# Patient Record
Sex: Female | Born: 1990
Health system: Southern US, Community
[De-identification: ages and names within clinical notes are randomized; demographics above are authoritative.]

## PROBLEM LIST (undated history)

## (undated) ENCOUNTER — Inpatient Hospital Stay (HOSPITAL_COMMUNITY): Payer: Self-pay

## (undated) DIAGNOSIS — O4443 Low lying placenta NOS or without hemorrhage, third trimester: Secondary | ICD-10-CM

## (undated) DIAGNOSIS — Z8619 Personal history of other infectious and parasitic diseases: Secondary | ICD-10-CM

## (undated) DIAGNOSIS — Z789 Other specified health status: Secondary | ICD-10-CM

## (undated) DIAGNOSIS — R87629 Unspecified abnormal cytological findings in specimens from vagina: Secondary | ICD-10-CM

## (undated) HISTORY — DX: Unspecified abnormal cytological findings in specimens from vagina: R87.629

## (undated) HISTORY — PX: BUNIONECTOMY: SHX129

## (undated) HISTORY — DX: Low lying placenta nos or without hemorrhage, third trimester: O44.43

## (undated) HISTORY — DX: Personal history of other infectious and parasitic diseases: Z86.19

---

## 2011-11-15 ENCOUNTER — Encounter: Payer: Self-pay | Admitting: *Deleted

## 2011-11-15 ENCOUNTER — Emergency Department (HOSPITAL_COMMUNITY)
Admission: EM | Admit: 2011-11-15 | Discharge: 2011-11-16 | Disposition: A | Discharge status: Home / Self Care | Payer: Commercial Indemnity | Attending: Emergency Medicine | Admitting: Emergency Medicine

## 2011-11-15 DIAGNOSIS — R3915 Urgency of urination: Secondary | ICD-10-CM | POA: Insufficient documentation

## 2011-11-15 DIAGNOSIS — N898 Other specified noninflammatory disorders of vagina: Secondary | ICD-10-CM | POA: Insufficient documentation

## 2011-11-15 DIAGNOSIS — N390 Urinary tract infection, site not specified: Secondary | ICD-10-CM

## 2011-11-15 DIAGNOSIS — N3090 Cystitis, unspecified without hematuria: Secondary | ICD-10-CM | POA: Insufficient documentation

## 2011-11-15 DIAGNOSIS — R300 Dysuria: Secondary | ICD-10-CM | POA: Insufficient documentation

## 2011-11-15 DIAGNOSIS — R319 Hematuria, unspecified: Secondary | ICD-10-CM | POA: Insufficient documentation

## 2011-11-15 NOTE — ED Notes (Signed)
Pt reports UTI symptoms 3 hours ago.  States she has hematuria, frequency, pain with urination, malodorous, cloudy urine.   Denies back/flank pain.

## 2011-11-16 ENCOUNTER — Encounter (HOSPITAL_COMMUNITY): Payer: Self-pay

## 2011-11-16 LAB — URINALYSIS, ROUTINE W REFLEX MICROSCOPIC
Glucose, UA: NEGATIVE mg/dL
Ketones, ur: 15 mg/dL — AB
Nitrite: POSITIVE — AB
Protein, ur: 100 mg/dL — AB
Specific Gravity, Urine: 1.022 (ref 1.005–1.030)
Urobilinogen, UA: 2 mg/dL — ABNORMAL HIGH (ref 0.0–1.0)
pH: 6 (ref 5.0–8.0)

## 2011-11-16 LAB — URINE MICROSCOPIC-ADD ON

## 2011-11-16 LAB — WET PREP, GENITAL
Clue Cells Wet Prep HPF POC: NONE SEEN
Trich, Wet Prep: NONE SEEN
Yeast Wet Prep HPF POC: NONE SEEN

## 2011-11-16 LAB — POCT PREGNANCY, URINE: Preg Test, Ur: NEGATIVE

## 2011-11-16 MED ORDER — CIPROFLOXACIN HCL 500 MG PO TABS
500.0000 mg | ORAL_TABLET | Freq: Once | ORAL | Status: AC
  Administered 2011-11-16: 500 mg via ORAL
  Filled 2011-11-16: qty 1

## 2011-11-16 MED ORDER — CIPROFLOXACIN HCL 500 MG PO TABS: 500.0000 mg | ORAL_TABLET | Freq: Two times a day (BID) | ORAL | Status: AC

## 2011-11-16 NOTE — ED Provider Notes (Signed)
History     CSN: [id number]  Arrival date & time 11/15/11  2147   First MD Initiated Contact with Patient 11/16/11 0125      Chief Complaint  Patient presents with  . Urinary Tract Infection  . Hematuria    (Consider location/radiation/quality/duration/timing/severity/associated sxs/prior treatment) Patient is a 20 y.o. female presenting with urinary tract infection and hematuria. The history is provided by the patient. No language interpreter was used.  Urinary Tract Infection This is a recurrent problem. The current episode started 3 to 5 hours ago. The problem occurs constantly. The problem has not changed since onset.Pertinent negatives include no chest pain, no abdominal pain, no headaches and no shortness of breath. Associated symptoms comments: No flank pain. The symptoms are aggravated by nothing. The symptoms are relieved by nothing. She has tried nothing for the symptoms. The treatment provided no relief.  Hematuria This is a new problem. The current episode started today. The problem is unchanged. She describes the hematuria as gross hematuria. The hematuria occurs throughout her entire urinary stream. She reports no clotting in her urine stream. Her pain is at a severity of 4/10. The pain is moderate. She describes her urine color as tea colored. Irritative symptoms include urgency. Associated symptoms include dysuria. Pertinent negatives include no abdominal pain, facial swelling, flank pain, genital pain, nausea or vomiting. (Vaginal discharge) She is sexually active. There is no history of GU trauma, kidney stones or STDs.    History reviewed. No pertinent past medical history.  History reviewed. No pertinent past surgical history.  History reviewed. No pertinent family history.  History  Substance Use Topics  . Smoking status: Never Smoker   . Smokeless tobacco: Not on file  . Alcohol Use: 0.6 oz/week    1 Cans of beer per week    OB History    Grav Para Term  Preterm Abortions TAB SAB Ect Mult Living                  Review of Systems  Constitutional: Positive for activity change.  HENT: Negative for facial swelling.   Respiratory: Negative for shortness of breath.   Cardiovascular: Negative for chest pain.  Gastrointestinal: Negative for nausea, vomiting and abdominal pain.  Genitourinary: Positive for dysuria, urgency, hematuria and vaginal discharge. Negative for flank pain.  Musculoskeletal: Negative.   Skin: Negative.   Neurological: Negative for headaches.  Hematological: Negative.   Psychiatric/Behavioral: Negative.     Allergies  Other  Home Medications   Current Outpatient Rx  Name Route Sig Dispense Refill  . CIPROFLOXACIN HCL 500 MG PO TABS Oral Take 1 tablet (500 mg total) by mouth 2 (two) times daily. 14 tablet 0    BP 130/87  Pulse 86  Temp(Src) 97.5 F (36.4 C) (Oral)  Resp 18  SpO2 97%  Physical Exam  Constitutional: She is oriented to person, place, and time. She appears well-developed and well-nourished.  HENT:  Head: Normocephalic and atraumatic.  Mouth/Throat: Oropharynx is clear and moist.  Eyes: Conjunctivae are normal. Pupils are equal, round, and reactive to light.  Neck: Normal range of motion. Neck supple.  Cardiovascular: Normal rate and regular rhythm.   Pulmonary/Chest: Effort normal and breath sounds normal. She has no wheezes.  Abdominal: Soft. Bowel sounds are normal. There is no tenderness. There is no rebound and no guarding.  Genitourinary: Cervix exhibits discharge. Cervix exhibits no motion tenderness. Right adnexum displays no tenderness. Left adnexum displays no tenderness.  Ida as chaperone  Musculoskeletal: Normal range of motion.  Neurological: She is alert and oriented to person, place, and time.  Skin: Skin is warm and dry.  Psychiatric: Thought content normal.    ED Course  Procedures (including critical care time)  Labs Reviewed  URINALYSIS, ROUTINE W REFLEX  MICROSCOPIC - Abnormal; Notable for the following:    Color, Urine RED (*) BIOCHEMICALS MAY BE AFFECTED BY COLOR   APPearance TURBID (*)    Hgb urine dipstick LARGE (*)    Bilirubin Urine SMALL (*)    Ketones, ur 15 (*)    Protein, ur 100 (*)    Urobilinogen, UA 2.0 (*)    Nitrite POSITIVE (*)    Leukocytes, UA LARGE (*)    All other components within normal limits  URINE MICROSCOPIC-ADD ON - Abnormal; Notable for the following:    Bacteria, UA MANY (*)    All other components within normal limits  WET PREP, GENITAL - Abnormal; Notable for the following:    WBC, Wet Prep HPF POC FEW (*)    All other components within normal limits  POCT PREGNANCY, URINE  GC/CHLAMYDIA PROBE AMP, GENITAL   No results found.   1. Urinary tract infection   2. Cystitis       MDM  Will be called in 3 days with the results of your cultures        Anzley Dibbern K Florentino Laabs-Rasch, MD 11/16/11 [phone]

## 2011-11-19 LAB — GC/CHLAMYDIA PROBE AMP, GENITAL
Chlamydia, DNA Probe: POSITIVE — AB
GC Probe Amp, Genital: NEGATIVE

## 2011-11-20 NOTE — ED Notes (Signed)
+  Chlamydia Chart sent to EDP office for review.  

## 2013-10-19 ENCOUNTER — Encounter (HOSPITAL_COMMUNITY): Payer: Self-pay | Admitting: Emergency Medicine

## 2013-10-19 DIAGNOSIS — R112 Nausea with vomiting, unspecified: Secondary | ICD-10-CM | POA: Insufficient documentation

## 2013-10-19 DIAGNOSIS — R630 Anorexia: Secondary | ICD-10-CM | POA: Insufficient documentation

## 2013-10-19 DIAGNOSIS — Z3202 Encounter for pregnancy test, result negative: Secondary | ICD-10-CM | POA: Insufficient documentation

## 2013-10-19 DIAGNOSIS — R109 Unspecified abdominal pain: Secondary | ICD-10-CM | POA: Insufficient documentation

## 2013-10-19 LAB — POCT PREGNANCY, URINE: Preg Test, Ur: NEGATIVE

## 2013-10-19 NOTE — ED Notes (Signed)
Pt. reports nausea and vomitting onset this afternoon , denies diarrhea , no fever or chills. 

## 2013-10-20 ENCOUNTER — Emergency Department (HOSPITAL_COMMUNITY)
Admission: EM | Admit: 2013-10-20 | Discharge: 2013-10-20 | Disposition: A | Discharge status: Home / Self Care | Payer: BC Managed Care – PPO | Attending: Emergency Medicine | Admitting: Emergency Medicine

## 2013-10-20 DIAGNOSIS — R109 Unspecified abdominal pain: Secondary | ICD-10-CM

## 2013-10-20 DIAGNOSIS — R1110 Vomiting, unspecified: Secondary | ICD-10-CM

## 2013-10-20 LAB — COMPREHENSIVE METABOLIC PANEL
ALT: 9 U/L (ref 0–35)
AST: 19 U/L (ref 0–37)
Albumin: 4.2 g/dL (ref 3.5–5.2)
Alkaline Phosphatase: 41 U/L (ref 39–117)
BUN: 12 mg/dL (ref 6–23)
CO2: 24 mEq/L (ref 19–32)
Calcium: 8.9 mg/dL (ref 8.4–10.5)
Chloride: 105 mEq/L (ref 96–112)
Creatinine, Ser: 0.71 mg/dL (ref 0.50–1.10)
GFR calc Af Amer: >90 mL/min (ref >90)
GFR calc non Af Amer: >90 mL/min (ref >90)
Glucose, Bld: 88 mg/dL (ref 70–99)
Potassium: 3.9 mEq/L (ref 3.5–5.1)
Sodium: 141 mEq/L (ref 135–145)
Total Bilirubin: 1.3 mg/dL — ABNORMAL HIGH (ref 0.3–1.2)
Total Protein: 7.4 g/dL (ref 6.0–8.3)

## 2013-10-20 LAB — URINALYSIS, ROUTINE W REFLEX MICROSCOPIC
Bilirubin Urine: NEGATIVE
Glucose, UA: NEGATIVE mg/dL
Hgb urine dipstick: NEGATIVE
Ketones, ur: 40 mg/dL — AB
Nitrite: NEGATIVE
Protein, ur: NEGATIVE mg/dL
Specific Gravity, Urine: 1.031 — ABNORMAL HIGH (ref 1.005–1.030)
Urobilinogen, UA: 1 mg/dL (ref 0.0–1.0)
pH: 8 (ref 5.0–8.0)

## 2013-10-20 LAB — CBC WITH DIFFERENTIAL/PLATELET
Basophils Absolute: 0 10*3/uL (ref 0.0–0.1)
Basophils Relative: 0 % (ref 0–1)
Eosinophils Absolute: 0 10*3/uL (ref 0.0–0.7)
Eosinophils Relative: 1 % (ref 0–5)
HCT: 40.1 % (ref 36.0–46.0)
Hemoglobin: 14.1 g/dL (ref 12.0–15.0)
Lymphocytes Relative: 7 % — ABNORMAL LOW (ref 12–46)
Lymphs Abs: 0.5 10*3/uL — ABNORMAL LOW (ref 0.7–4.0)
MCH: 31.9 pg (ref 26.0–34.0)
MCHC: 35.2 g/dL (ref 30.0–36.0)
MCV: 90.7 fL (ref 78.0–100.0)
Monocytes Absolute: 0.4 10*3/uL (ref 0.1–1.0)
Monocytes Relative: 6 % (ref 3–12)
Neutro Abs: 6.3 10*3/uL (ref 1.7–7.7)
Neutrophils Relative %: 87 % — ABNORMAL HIGH (ref 43–77)
Platelets: 292 10*3/uL (ref 150–400)
RBC: 4.42 MIL/uL (ref 3.87–5.11)
RDW: 13.2 % (ref 11.5–15.5)
WBC: 7.2 10*3/uL (ref 4.0–10.5)

## 2013-10-20 LAB — URINE MICROSCOPIC-ADD ON

## 2013-10-20 MED ORDER — ONDANSETRON 4 MG PO TBDP
4.0000 mg | ORAL_TABLET | Freq: Once | ORAL | Status: AC
  Administered 2013-10-20: 4 mg via ORAL
  Filled 2013-10-20: qty 1

## 2013-10-20 MED ORDER — ONDANSETRON 4 MG PO TBDP: ORAL_TABLET | ORAL | Status: DC

## 2013-10-20 NOTE — ED Notes (Signed)
Pt denies CP, SOB, abdominal pain, Diarrhea, urinary problems, vaginal discharge, pain. Only symptom is constant nausea starting at 4pm and vomited x2

## 2013-10-20 NOTE — ED Provider Notes (Signed)
CSN: [id number]     Arrival date & time 10/19/13  2245 History   First MD Initiated Contact with Patient 10/20/13 0101     Chief Complaint  Patient presents with  . Emesis   (Consider location/radiation/quality/duration/timing/severity/associated sxs/prior Treatment) HPI Comments: 22 yo female with no medical hx, mild etoh, non smoker presents with few episodes of vomiting since this afternoon. No sick contacts or travel.  No abd pain, mild cramping.  No urinary or pelvic sxs.  Same sexual partner.  Nothing improves, intermittent.   Patient is a 22 y.o. female presenting with vomiting. The history is provided by the patient.  Emesis Associated symptoms: no abdominal pain, no chills and no headaches     History reviewed. No pertinent past medical history. History reviewed. No pertinent past surgical history. No family history on file. History  Substance Use Topics  . Smoking status: Never Smoker   . Smokeless tobacco: Not on file  . Alcohol Use: 0.6 oz/week    1 Cans of beer per week   OB History   Grav Para Term Preterm Abortions TAB SAB Ect Mult Living                 Review of Systems  Constitutional: Positive for appetite change. Negative for fever and chills.  HENT: Negative for congestion.   Respiratory: Negative for shortness of breath.   Cardiovascular: Negative for chest pain.  Gastrointestinal: Positive for nausea and vomiting. Negative for abdominal pain.  Genitourinary: Negative for dysuria and flank pain.  Musculoskeletal: Negative for back pain, neck pain and neck stiffness.  Skin: Negative for rash.  Neurological: Negative for light-headedness and headaches.    Allergies  Other  Home Medications  No current outpatient prescriptions on file. BP 136/61  Pulse 78  Temp(Src) 98.1 F (36.7 C) (Oral)  Resp 18  Ht 5\' 7"  (1.702 m)  Wt 125 lb 3 oz (56.785 kg)  BMI 19.60 kg/m2  SpO2 99%  LMP 09/18/2013 Physical Exam  Nursing note and vitals  reviewed. Constitutional: She is oriented to person, place, and time. She appears well-developed and well-nourished.  HENT:  Head: Normocephalic and atraumatic.  Mild dry mm  Eyes: Conjunctivae are normal. Right eye exhibits no discharge. Left eye exhibits no discharge.  Neck: Normal range of motion. Neck supple. No tracheal deviation present.  Cardiovascular: Normal rate and regular rhythm.   Pulmonary/Chest: Effort normal and breath sounds normal.  Abdominal: Soft. She exhibits no distension. There is tenderness (mild central/ suprapubic). There is no guarding.  Musculoskeletal: She exhibits no edema.  Neurological: She is alert and oriented to person, place, and time.  Skin: Skin is warm. No rash noted.  Psychiatric: She has a normal mood and affect.    ED Course  Procedures (including critical care time) Labs Review Labs Reviewed  URINALYSIS, ROUTINE W REFLEX MICROSCOPIC - Abnormal; Notable for the following:    APPearance CLOUDY (*)    Specific Gravity, Urine 1.031 (*)    Ketones, ur 40 (*)    Leukocytes, UA SMALL (*)    All other components within normal limits  CBC WITH DIFFERENTIAL - Abnormal; Notable for the following:    Neutrophils Relative % 87 (*)    Lymphocytes Relative 7 (*)    Lymphs Abs 0.5 (*)    All other components within normal limits  COMPREHENSIVE METABOLIC PANEL - Abnormal; Notable for the following:    Total Bilirubin 1.3 (*)    All other components within normal limits  URINE MICROSCOPIC-ADD ON - Abnormal; Notable for the following:    Squamous Epithelial / LPF MANY (*)    Bacteria, UA FEW (*)    All other components within normal limits  URINE CULTURE  POCT PREGNANCY, URINE   Imaging Review No results found.  EKG Interpretation   None       MDM   1. Vomiting   2. Abdominal cramping    Likely gastritis/ viral.  Labs, po fluids, antiemetics. Plan for recheck. Low concern for acute abdo or appy at this time.  Pt on menstrual period.    Pt improved on recheck.  Results and differential diagnosis were discussed with the patient. Close follow up outpatient was discussed, patient comfortable with the plan.     Enid Skeens, MD 10/21/13 [facility]

## 2013-10-21 LAB — URINE CULTURE: Colony Count: >=100000

## 2015-06-07 LAB — OB RESULTS CONSOLE GC/CHLAMYDIA
Chlamydia: NEGATIVE
Gonorrhea: NEGATIVE

## 2015-06-07 LAB — OB RESULTS CONSOLE HIV ANTIBODY (ROUTINE TESTING): HIV: NONREACTIVE

## 2015-06-07 LAB — OB RESULTS CONSOLE ANTIBODY SCREEN: Antibody Screen: NEGATIVE

## 2015-06-07 LAB — OB RESULTS CONSOLE RPR: RPR: NONREACTIVE

## 2015-06-07 LAB — OB RESULTS CONSOLE HEPATITIS B SURFACE ANTIGEN: Hepatitis B Surface Ag: NEGATIVE

## 2015-06-07 LAB — OB RESULTS CONSOLE ABO/RH: RH Type: POSITIVE

## 2015-06-07 LAB — OB RESULTS CONSOLE RUBELLA ANTIBODY, IGM: Rubella: IMMUNE

## 2015-11-19 NOTE — L&D Delivery Note (Signed)
Delivery Note At 5:28 AM a viable and healthy female was delivered via Vaginal, Spontaneous Delivery (Presentation: Right Occiput Anterior).  APGAR: 6, 9; weight  pending.   Placenta status: Intact, Spontaneous  Pathology for maternal temp.  Cord: 3 vessels with the following complications: None.  Cord pH: none  Anesthesia: Epidural  Episiotomy: None Lacerations: 2nd degree;Perineal Suture Repair: 3.0 chromic Est. Blood Loss (mL):  400  Mom to postpartum.  Baby to Couplet care / Skin to Skin.  Morgan Perry A 01/15/2016, 6:32 AM

## 2015-12-06 LAB — OB RESULTS CONSOLE GBS: GBS: NEGATIVE

## 2015-12-08 ENCOUNTER — Other Ambulatory Visit (HOSPITAL_COMMUNITY): Payer: Self-pay | Admitting: Obstetrics and Gynecology

## 2015-12-08 DIAGNOSIS — Z3689 Encounter for other specified antenatal screening: Secondary | ICD-10-CM

## 2015-12-08 DIAGNOSIS — O283 Abnormal ultrasonic finding on antenatal screening of mother: Secondary | ICD-10-CM

## 2015-12-08 DIAGNOSIS — Z3A36 36 weeks gestation of pregnancy: Secondary | ICD-10-CM

## 2015-12-12 ENCOUNTER — Other Ambulatory Visit (HOSPITAL_COMMUNITY): Payer: Self-pay | Admitting: Obstetrics and Gynecology

## 2015-12-12 ENCOUNTER — Encounter (HOSPITAL_COMMUNITY): Payer: Self-pay

## 2015-12-12 ENCOUNTER — Ambulatory Visit (HOSPITAL_COMMUNITY): Admission: RE | Admit: 2015-12-12 | Payer: BLUE CROSS/BLUE SHIELD | Source: Ambulatory Visit

## 2015-12-12 ENCOUNTER — Ambulatory Visit (HOSPITAL_COMMUNITY)
Admission: RE | Admit: 2015-12-12 | Discharge: 2015-12-12 | Disposition: A | Discharge status: Home / Self Care | Payer: BLUE CROSS/BLUE SHIELD | Source: Ambulatory Visit | Attending: Obstetrics and Gynecology | Admitting: Obstetrics and Gynecology

## 2015-12-12 DIAGNOSIS — Z3A36 36 weeks gestation of pregnancy: Secondary | ICD-10-CM

## 2015-12-12 DIAGNOSIS — Z3689 Encounter for other specified antenatal screening: Secondary | ICD-10-CM

## 2015-12-12 DIAGNOSIS — O283 Abnormal ultrasonic finding on antenatal screening of mother: Secondary | ICD-10-CM

## 2015-12-12 DIAGNOSIS — Z36 Encounter for antenatal screening of mother: Secondary | ICD-10-CM | POA: Insufficient documentation

## 2015-12-12 DIAGNOSIS — O329XX0 Maternal care for malpresentation of fetus, unspecified, not applicable or unspecified: Secondary | ICD-10-CM

## 2015-12-13 ENCOUNTER — Encounter (HOSPITAL_COMMUNITY): Payer: Self-pay

## 2016-01-01 ENCOUNTER — Other Ambulatory Visit: Payer: Self-pay | Admitting: Obstetrics and Gynecology

## 2016-01-01 ENCOUNTER — Telehealth (HOSPITAL_COMMUNITY): Payer: Self-pay | Admitting: *Deleted

## 2016-01-01 ENCOUNTER — Encounter (HOSPITAL_COMMUNITY): Payer: Self-pay | Admitting: *Deleted

## 2016-01-01 NOTE — Telephone Encounter (Signed)
Preadmission screen  

## 2016-01-03 ENCOUNTER — Inpatient Hospital Stay (HOSPITAL_COMMUNITY)
Admission: AD | Admit: 2016-01-03 | Discharge: 2016-01-03 | Disposition: A | Discharge status: Home / Self Care | Payer: Managed Care, Other (non HMO) | Source: Ambulatory Visit | Attending: Obstetrics and Gynecology | Admitting: Obstetrics and Gynecology

## 2016-01-03 ENCOUNTER — Encounter (HOSPITAL_COMMUNITY): Payer: Self-pay | Admitting: *Deleted

## 2016-01-03 DIAGNOSIS — Z3A39 39 weeks gestation of pregnancy: Secondary | ICD-10-CM | POA: Diagnosis not present

## 2016-01-03 DIAGNOSIS — O26893 Other specified pregnancy related conditions, third trimester: Secondary | ICD-10-CM | POA: Diagnosis not present

## 2016-01-03 DIAGNOSIS — N898 Other specified noninflammatory disorders of vagina: Secondary | ICD-10-CM | POA: Insufficient documentation

## 2016-01-03 HISTORY — DX: Other specified health status: Z78.9

## 2016-01-03 LAB — URINALYSIS, DIPSTICK ONLY
Bilirubin Urine: NEGATIVE
Glucose, UA: NEGATIVE mg/dL
Ketones, ur: NEGATIVE mg/dL
Nitrite: NEGATIVE
Protein, ur: NEGATIVE mg/dL
Specific Gravity, Urine: 1.01 (ref 1.005–1.030)
pH: 6.5 (ref 5.0–8.0)

## 2016-01-03 LAB — AMNISURE RUPTURE OF MEMBRANE (ROM) NOT AT ARMC: Amnisure ROM: NEGATIVE

## 2016-01-03 NOTE — Discharge Instructions (Signed)
Third Trimester of Pregnancy The third trimester is from week 29 through week 42, months 7 through 9. This trimester is when your unborn baby (fetus) is growing very fast. At the end of the ninth month, the unborn baby is about 20 inches in length. It weighs about 6-10 pounds.  HOME CARE   Avoid all smoking, herbs, and alcohol. Avoid drugs not approved by your doctor.  Do not use any tobacco products, including cigarettes, chewing tobacco, and electronic cigarettes. If you need help quitting, ask your doctor. You may get counseling or other support to help you quit.  Only take medicine as told by your doctor. Some medicines are safe and some are not during pregnancy.  Exercise only as told by your doctor. Stop exercising if you start having cramps.  Eat regular, healthy meals.  Wear a good support bra if your breasts are tender.  Do not use hot tubs, steam rooms, or saunas.  Wear your seat belt when driving.  Avoid raw meat, uncooked cheese, and liter boxes and soil used by cats.  Take your prenatal vitamins.  Take 1500-2000 milligrams of calcium daily starting at the 20th week of pregnancy until you deliver your baby.  Try taking medicine that helps you poop (stool softener) as needed, and if your doctor approves. Eat more fiber by eating fresh fruit, vegetables, and whole grains. Drink enough fluids to keep your pee (urine) clear or pale yellow.  Take warm water baths (sitz baths) to soothe pain or discomfort caused by hemorrhoids. Use hemorrhoid cream if your doctor approves.  If you have puffy, bulging veins (varicose veins), wear support hose. Raise (elevate) your feet for 15 minutes, 3-4 times a day. Limit salt in your diet.  Avoid heavy lifting, wear low heels, and sit up straight.  Rest with your legs raised if you have leg cramps or low back pain.  Visit your dentist if you have not gone during your pregnancy. Use a soft toothbrush to brush your teeth. Be gentle when you  floss.  You can have sex (intercourse) unless your doctor tells you not to.  Do not travel far distances unless you must. Only do so with your doctor's approval.  Take prenatal classes.  Practice driving to the hospital.  Pack your hospital bag.  Prepare the baby's room.  Go to your doctor visits. GET HELP IF:  You are not sure if you are in labor or if your water has broken.  You are dizzy.  You have mild cramps or pressure in your lower belly (abdominal).  You have a nagging pain in your belly area.  You continue to feel sick to your stomach (nauseous), throw up (vomit), or have watery poop (diarrhea).  You have bad smelling fluid coming from your vagina.  You have pain with peeing (urination). GET HELP RIGHT AWAY IF:   You have a fever.  You are leaking fluid from your vagina.  You are spotting or bleeding from your vagina.  You have severe belly cramping or pain.  You lose or gain weight rapidly.  You have trouble catching your breath and have chest pain.  You notice sudden or extreme puffiness (swelling) of your face, hands, ankles, feet, or legs.  You have not felt the baby move in over an hour.  You have severe headaches that do not go away with medicine.  You have vision changes.   This information is not intended to replace advice given to you by your health care  provider. Make sure you discuss any questions you have with your health care provider. °  °Document Released: 01/29/2010 Document Revised: 11/25/2014 Document Reviewed: 01/05/2013 °Elsevier Interactive Patient Education ©2016 [employer] °Braxton Hicks Contractions °Contractions of the uterus can occur throughout pregnancy. Contractions are not always a sign that you are in labor.  °WHAT ARE BRAXTON HICKS CONTRACTIONS?  °Contractions that occur before labor are called Braxton Hicks contractions, or false labor. Toward the end of pregnancy (32-34 weeks), these contractions can develop more often  and may become more forceful. This is not true labor because these contractions do not result in opening (dilatation) and thinning of the cervix. They are sometimes difficult to tell apart from true labor because these contractions can be forceful and people have different pain tolerances. You should not feel embarrassed if you go to the hospital with false labor. Sometimes, the only way to tell if you are in true labor is for your health care provider to look for changes in the cervix. °If there are no prenatal problems or other health problems associated with the pregnancy, it is completely safe to be sent home with false labor and await the onset of true labor. °HOW CAN YOU TELL THE DIFFERENCE BETWEEN TRUE AND FALSE LABOR? °False Labor °· The contractions of false labor are usually shorter and not as hard as those of true labor.   °· The contractions are usually irregular.   °· The contractions are often felt in the front of the lower abdomen and in the groin.   °· The contractions may go away when you walk around or change positions while lying down.   °· The contractions get weaker and are shorter lasting as time goes on.   °· The contractions do not usually become progressively stronger, regular, and closer together as with true labor.   °True Labor °· Contractions in true labor last 30-70 seconds, become very regular, usually become more intense, and increase in frequency.   °· The contractions do not go away with walking.   °· The discomfort is usually felt in the top of the uterus and spreads to the lower abdomen and low back.   °· True labor can be determined by your health care provider with an exam. This will show that the cervix is dilating and getting thinner.   °WHAT TO REMEMBER °· Keep up with your usual exercises and follow other instructions given by your health care provider.   °· Take medicines as directed by your health care provider.   °· Keep your regular prenatal appointments.   °· Eat and  drink lightly if you think you are going into labor.   °· If Braxton Hicks contractions are making you uncomfortable:   °· Change your position from lying down or resting to walking, or from walking to resting.   °· Sit and rest in a tub of warm water.   °· Drink 2-3 glasses of water. Dehydration may cause these contractions.   °· Do slow and deep breathing several times an hour.   °WHEN SHOULD I SEEK IMMEDIATE MEDICAL CARE? °Seek immediate medical care if: °· Your contractions become stronger, more regular, and closer together.   °· You have fluid leaking or gushing from your vagina.   °· You have a fever.   °· You pass blood-tinged mucus.   °· You have vaginal bleeding.   °· You have continuous abdominal pain.   °· You have low back pain that you never had before.   °· You feel your baby's head pushing down and causing pelvic pressure.   °· Your baby is not moving as much as it   used to.    This information is not intended to replace advice given to you by your health care provider. Make sure you discuss any questions you have with your health care provider.   Document Released: 11/04/2005 Document Revised: 11/09/2013 Document Reviewed: 08/16/2013 Elsevier Interactive Patient Education 2016 [place]. Fetal Movement Counts Patient Name: __________________________________________________ Patient Due Date: ____________________ Performing a fetal movement count is highly recommended in high-risk pregnancies, but it is good for every pregnant woman to do. Your health care provider may ask you to start counting fetal movements at 28 weeks of the pregnancy. Fetal movements often increase:  After eating a full meal.  After physical activity.  After eating or drinking something sweet or cold.  At rest. Pay attention to when you feel the baby is most active. This will help you notice a pattern of your baby's sleep and wake cycles and what factors contribute to an increase in fetal movement. It is  important to perform a fetal movement count at the same time each day when your baby is normally most active.  HOW TO COUNT FETAL MOVEMENTS  Find a quiet and comfortable area to sit or lie down on your left side. Lying on your left side provides the best blood and oxygen circulation to your baby.  Write down the day and time on a sheet of paper or in a journal.  Start counting kicks, flutters, swishes, rolls, or jabs in a 2-hour period. You should feel at least 10 movements within 2 hours.  If you do not feel 10 movements in 2 hours, wait 2-3 hours and count again. Look for a change in the pattern or not enough counts in 2 hours. SEEK MEDICAL CARE IF:  You feel less than 10 counts in 2 hours, tried twice.  There is no movement in over an hour.  The pattern is changing or taking longer each day to reach 10 counts in 2 hours.  You feel the baby is not moving as he or she usually does. Date: ____________ Movements: ____________ Start time: ____________ Morgan Perry time: ____________  Date: ____________ Movements: ____________ Start time: ____________ Morgan Perry time: ____________ Date: ____________ Movements: ____________ Start time: ____________ Morgan Perry time: ____________ Date: ____________ Movements: ____________ Start time: ____________ Morgan Perry time: ____________ Date: ____________ Movements: ____________ Start time: ____________ Morgan Perry time: ____________ Date: ____________ Movements: ____________ Start time: ____________ Morgan Perry time: ____________ Date: ____________ Movements: ____________ Start time: ____________ Morgan Perry time: ____________ Date: ____________ Movements: ____________ Start time: ____________ Morgan Perry time: ____________  Date: ____________ Movements: ____________ Start time: ____________ Morgan Perry time: ____________ Date: ____________ Movements: ____________ Start time: ____________ Morgan Perry time: ____________ Date: ____________ Movements: ____________ Start time: ____________ Morgan Perry time:  ____________ Date: ____________ Movements: ____________ Start time: ____________ Morgan Perry time: ____________ Date: ____________ Movements: ____________ Start time: ____________ Morgan Perry time: ____________ Date: ____________ Movements: ____________ Start time: ____________ Morgan Perry time: ____________ Date: ____________ Movements: ____________ Start time: ____________ Morgan Perry time: ____________  Date: ____________ Movements: ____________ Start time: ____________ Morgan Perry time: ____________ Date: ____________ Movements: ____________ Start time: ____________ Morgan Perry time: ____________ Date: ____________ Movements: ____________ Start time: ____________ Morgan Perry time: ____________ Date: ____________ Movements: ____________ Start time: ____________ Morgan Perry time: ____________ Date: ____________ Movements: ____________ Start time: ____________ Morgan Perry time: ____________ Date: ____________ Movements: ____________ Start time: ____________ Morgan Perry time: ____________ Date: ____________ Movements: ____________ Start time: ____________ Morgan Perry time: ____________  Date: ____________ Movements: ____________ Start time: ____________ Morgan Perry time: ____________ Date: ____________ Movements: ____________ Start time: ____________ Morgan Perry time: ____________ Date: ____________ Movements: ____________ Start time: ____________  Finish time: ____________ Date: ____________ Movements: ____________ Start time: ____________ Morgan Perry time: ____________ Date: ____________ Movements: ____________ Start time: ____________ Morgan Perry time: ____________ Date: ____________ Movements: ____________ Start time: ____________ Morgan Perry time: ____________ Date: ____________ Movements: ____________ Start time: ____________ Morgan Perry time: ____________  Date: ____________ Movements: ____________ Start time: ____________ Morgan Perry time: ____________ Date: ____________ Movements: ____________ Start time: ____________ Morgan Perry time: ____________ Date: ____________ Movements:  ____________ Start time: ____________ Morgan Perry time: ____________ Date: ____________ Movements: ____________ Start time: ____________ Morgan Perry time: ____________ Date: ____________ Movements: ____________ Start time: ____________ Morgan Perry time: ____________ Date: ____________ Movements: ____________ Start time: ____________ Morgan Perry time: ____________ Date: ____________ Movements: ____________ Start time: ____________ Morgan Perry time: ____________  Date: ____________ Movements: ____________ Start time: ____________ Morgan Perry time: ____________ Date: ____________ Movements: ____________ Start time: ____________ Morgan Perry time: ____________ Date: ____________ Movements: ____________ Start time: ____________ Morgan Perry time: ____________ Date: ____________ Movements: ____________ Start time: ____________ Morgan Perry time: ____________ Date: ____________ Movements: ____________ Start time: ____________ Morgan Perry time: ____________ Date: ____________ Movements: ____________ Start time: ____________ Morgan Perry time: ____________ Date: ____________ Movements: ____________ Start time: ____________ Morgan Perry time: ____________  Date: ____________ Movements: ____________ Start time: ____________ Morgan Perry time: ____________ Date: ____________ Movements: ____________ Start time: ____________ Morgan Perry time: ____________ Date: ____________ Movements: ____________ Start time: ____________ Morgan Perry time: ____________ Date: ____________ Movements: ____________ Start time: ____________ Morgan Perry time: ____________ Date: ____________ Movements: ____________ Start time: ____________ Morgan Perry time: ____________ Date: ____________ Movements: ____________ Start time: ____________ Morgan Perry time: ____________ Date: ____________ Movements: ____________ Start time: ____________ Morgan Perry time: ____________  Date: ____________ Movements: ____________ Start time: ____________ Morgan Perry time: ____________ Date: ____________ Movements: ____________ Start time: ____________ Morgan Perry  time: ____________ Date: ____________ Movements: ____________ Start time: ____________ Morgan Perry time: ____________ Date: ____________ Movements: ____________ Start time: ____________ Morgan Perry time: ____________ Date: ____________ Movements: ____________ Start time: ____________ Morgan Perry time: ____________ Date: ____________ Movements: ____________ Start time: ____________ Morgan Perry time: ____________   This information is not intended to replace advice given to you by your health care provider. Make sure you discuss any questions you have with your health care provider.   Document Released: 12/04/2006 Document Revised: 11/25/2014 Document Reviewed: 08/31/2012 Elsevier Interactive Patient Education [employer].

## 2016-01-03 NOTE — MAU Note (Signed)
Patient states she noticed more fluid yesterday on pad, but not much during the evening yesterday.  No discharge noted on pantyliner this am, but increased fluid noted today with a small gush.  Discharge has been yellow in color.

## 2016-01-14 ENCOUNTER — Encounter (HOSPITAL_COMMUNITY): Payer: Self-pay

## 2016-01-14 ENCOUNTER — Inpatient Hospital Stay (HOSPITAL_COMMUNITY): Payer: Managed Care, Other (non HMO) | Admitting: Anesthesiology

## 2016-01-14 ENCOUNTER — Inpatient Hospital Stay (HOSPITAL_COMMUNITY)
Admission: RE | Admit: 2016-01-14 | Discharge: 2016-01-17 | DRG: 775 | Disposition: A | Discharge status: Home / Self Care | Payer: Managed Care, Other (non HMO) | Source: Ambulatory Visit | Attending: Obstetrics and Gynecology | Admitting: Obstetrics and Gynecology

## 2016-01-14 DIAGNOSIS — O411290 Chorioamnionitis, unspecified trimester, not applicable or unspecified: Secondary | ICD-10-CM | POA: Diagnosis present

## 2016-01-14 DIAGNOSIS — O9081 Anemia of the puerperium: Secondary | ICD-10-CM | POA: Diagnosis not present

## 2016-01-14 DIAGNOSIS — Z3A40 40 weeks gestation of pregnancy: Secondary | ICD-10-CM

## 2016-01-14 DIAGNOSIS — Z8279 Family history of other congenital malformations, deformations and chromosomal abnormalities: Secondary | ICD-10-CM

## 2016-01-14 DIAGNOSIS — D62 Acute posthemorrhagic anemia: Secondary | ICD-10-CM | POA: Diagnosis not present

## 2016-01-14 DIAGNOSIS — O480 Post-term pregnancy: Secondary | ICD-10-CM | POA: Diagnosis present

## 2016-01-14 DIAGNOSIS — O411230 Chorioamnionitis, third trimester, not applicable or unspecified: Secondary | ICD-10-CM | POA: Diagnosis present

## 2016-01-14 LAB — CBC
HCT: 36.3 % (ref 36.0–46.0)
HCT: 36.8 % (ref 36.0–46.0)
Hemoglobin: 12.3 g/dL (ref 12.0–15.0)
Hemoglobin: 12.5 g/dL (ref 12.0–15.0)
MCH: 28.7 pg (ref 26.0–34.0)
MCH: 28.9 pg (ref 26.0–34.0)
MCHC: 33.9 g/dL (ref 30.0–36.0)
MCHC: 34 g/dL (ref 30.0–36.0)
MCV: 84.6 fL (ref 78.0–100.0)
MCV: 85.2 fL (ref 78.0–100.0)
Platelets: 275 10*3/uL (ref 150–400)
Platelets: 280 10*3/uL (ref 150–400)
RBC: 4.26 MIL/uL (ref 3.87–5.11)
RBC: 4.35 MIL/uL (ref 3.87–5.11)
RDW: 14.1 % (ref 11.5–15.5)
RDW: 14.2 % (ref 11.5–15.5)
WBC: 11.2 10*3/uL — ABNORMAL HIGH (ref 4.0–10.5)
WBC: 7.3 10*3/uL (ref 4.0–10.5)

## 2016-01-14 LAB — RPR: RPR Ser Ql: NONREACTIVE

## 2016-01-14 MED ORDER — EPHEDRINE 5 MG/ML INJ: 10.0000 mg | INTRAVENOUS | Status: DC | PRN

## 2016-01-14 MED ORDER — LACTATED RINGERS IV SOLN
INTRAVENOUS | Status: DC
  Administered 2016-01-14 (×4): via INTRAVENOUS

## 2016-01-14 MED ORDER — OXYTOCIN 10 UNIT/ML IJ SOLN: 10.0000 [IU] | Freq: Once | INTRAMUSCULAR | Status: DC

## 2016-01-14 MED ORDER — LACTATED RINGERS IV SOLN
INTRAVENOUS | Status: DC
Start: 2016-01-14 — End: 2016-01-15

## 2016-01-14 MED ORDER — OXYTOCIN BOLUS FROM INFUSION
500.0000 mL | INTRAVENOUS | Status: DC
  Administered 2016-01-15: 500 mL via INTRAVENOUS

## 2016-01-14 MED ORDER — TERBUTALINE SULFATE 1 MG/ML IJ SOLN
INTRAMUSCULAR | Status: AC
  Filled 2016-01-14: qty 1

## 2016-01-14 MED ORDER — OXYTOCIN 10 UNIT/ML IJ SOLN: 2.5000 [IU]/h | INTRAVENOUS | Status: DC

## 2016-01-14 MED ORDER — OXYCODONE-ACETAMINOPHEN 5-325 MG PO TABS: 2.0000 | ORAL_TABLET | ORAL | Status: DC | PRN

## 2016-01-14 MED ORDER — LACTATED RINGERS IV SOLN
500.0000 mL | INTRAVENOUS | Status: DC | PRN
  Administered 2016-01-14 – 2016-01-15 (×2): 500 mL via INTRAVENOUS

## 2016-01-14 MED ORDER — LIDOCAINE HCL (PF) 1 % IJ SOLN
INTRAMUSCULAR | Status: DC | PRN
  Administered 2016-01-14 (×2): 4 mL via EPIDURAL

## 2016-01-14 MED ORDER — PHENYLEPHRINE 40 MCG/ML (10ML) SYRINGE FOR IV PUSH (FOR BLOOD PRESSURE SUPPORT)
80.0000 ug | PREFILLED_SYRINGE | INTRAVENOUS | Status: DC | PRN
  Filled 2016-01-14: qty 20

## 2016-01-14 MED ORDER — LACTATED RINGERS IV SOLN
500.0000 mL | Freq: Once | INTRAVENOUS | Status: AC
  Administered 2016-01-14: 500 mL via INTRAVENOUS

## 2016-01-14 MED ORDER — CITRIC ACID-SODIUM CITRATE 334-500 MG/5ML PO SOLN
30.0000 mL | ORAL | Status: DC | PRN
  Filled 2016-01-14: qty 15

## 2016-01-14 MED ORDER — PHENYLEPHRINE 40 MCG/ML (10ML) SYRINGE FOR IV PUSH (FOR BLOOD PRESSURE SUPPORT): 80.0000 ug | PREFILLED_SYRINGE | INTRAVENOUS | Status: DC | PRN

## 2016-01-14 MED ORDER — DIPHENHYDRAMINE HCL 50 MG/ML IJ SOLN: 12.5000 mg | INTRAMUSCULAR | Status: DC | PRN

## 2016-01-14 MED ORDER — OXYCODONE-ACETAMINOPHEN 5-325 MG PO TABS: 1.0000 | ORAL_TABLET | ORAL | Status: DC | PRN

## 2016-01-14 MED ORDER — OXYTOCIN 10 UNIT/ML IJ SOLN
1.0000 m[IU]/min | INTRAVENOUS | Status: DC
  Administered 2016-01-14: 2 m[IU]/min via INTRAVENOUS
  Administered 2016-01-14: 9 m[IU]/min via INTRAVENOUS
  Filled 2016-01-14: qty 4

## 2016-01-14 MED ORDER — ONDANSETRON HCL 4 MG/2ML IJ SOLN
4.0000 mg | Freq: Four times a day (QID) | INTRAMUSCULAR | Status: DC | PRN
  Filled 2016-01-14 (×2): qty 2

## 2016-01-14 MED ORDER — TERBUTALINE SULFATE 1 MG/ML IJ SOLN: 0.2500 mg | Freq: Once | INTRAMUSCULAR | Status: DC | PRN

## 2016-01-14 MED ORDER — FENTANYL 2.5 MCG/ML BUPIVACAINE 1/10 % EPIDURAL INFUSION (WH - ANES)
14.0000 mL/h | INTRAMUSCULAR | Status: DC | PRN
  Administered 2016-01-14 (×2): 14 mL/h via EPIDURAL
  Filled 2016-01-14 (×2): qty 125

## 2016-01-14 MED ORDER — BUTORPHANOL TARTRATE 1 MG/ML IJ SOLN
2.0000 mg | INTRAMUSCULAR | Status: DC | PRN
  Administered 2016-01-14 (×2): 2 mg via INTRAVENOUS
  Filled 2016-01-14 (×2): qty 2

## 2016-01-14 MED ORDER — ACETAMINOPHEN 325 MG PO TABS
650.0000 mg | ORAL_TABLET | ORAL | Status: DC | PRN
  Administered 2016-01-15: 650 mg via ORAL
  Filled 2016-01-14 (×2): qty 2

## 2016-01-14 MED ORDER — LIDOCAINE HCL (PF) 1 % IJ SOLN
30.0000 mL | INTRAMUSCULAR | Status: DC | PRN
  Administered 2016-01-15: 30 mL via SUBCUTANEOUS
  Filled 2016-01-14: qty 30

## 2016-01-14 MED ORDER — LACTATED RINGERS IV SOLN
INTRAVENOUS | Status: DC
  Administered 2016-01-14: 17:00:00 via INTRAUTERINE

## 2016-01-14 MED ORDER — MISOPROSTOL 25 MCG QUARTER TABLET
25.0000 ug | ORAL_TABLET | ORAL | Status: DC | PRN
  Administered 2016-01-14: 25 ug via VAGINAL
  Filled 2016-01-14: qty 0.25

## 2016-01-14 MED ORDER — ZOLPIDEM TARTRATE 5 MG PO TABS
5.0000 mg | ORAL_TABLET | Freq: Every evening | ORAL | Status: DC | PRN
Start: 2016-01-14 — End: 2016-01-15

## 2016-01-14 NOTE — Progress Notes (Signed)
S; comfortable  O:  Pitocin 19 miu VE 5/100/-1 per RN Amnioinfusion  Tracing: baseline 145 (+) accels some early decels Ctx q  1 1/2-  IMP: postdates Prolonged active phase P) reexam 2hr after last exam. Cont pitocin

## 2016-01-14 NOTE — Progress Notes (Signed)
S; comfortable  O: Pitocin Epidural  VE loose 4 cm/90/-3/-2 AROM mod mec ISE/IUPC placed  Tracing: baseline 135 (+) variability (+) accels Ctx q 2 mins  FHR deceleration down to 70's x 1 1/2 min with return to baseline noted thereafter with good variability Not responsive to positional( maternal) change ( left/right) Pitocin discontinued( 18 miu) Fluid bolus  IMP: protracted latent/active phase Post dates P)  Pitocin remain off. Close fetal monitoring. Resume pitocin if not satisfactory ctx strength and tracing ok

## 2016-01-14 NOTE — Anesthesia Procedure Notes (Signed)
Epidural Patient location during procedure: OB Start time: 01/14/2016 3:39 PM  Staffing Anesthesiologist: Mal Amabile Performed by: anesthesiologist   Preanesthetic Checklist Completed: patient identified, site marked, surgical consent, pre-op evaluation, timeout performed, IV checked, risks and benefits discussed and monitors and equipment checked  Epidural Patient position: sitting Prep: site prepped and draped and DuraPrep Patient monitoring: continuous pulse ox and blood pressure Approach: midline Location: L4-L5 Injection technique: LOR air  Needle:  Needle type: Tuohy  Needle gauge: 17 G Needle length: 9 cm and 9 Needle insertion depth: 4 cm Catheter type: closed end flexible Catheter size: 19 Gauge Catheter at skin depth: 9 cm Test dose: negative and Other  Assessment Events: blood not aspirated, injection not painful, no injection resistance, negative IV test and no paresthesia  Additional Notes Patient identified. Risks and benefits discussed including failed block, incomplete  Pain control, post dural puncture headache, nerve damage, paralysis, blood pressure Changes, nausea, vomiting, reactions to medications-both toxic and allergic and post Partum back pain. All questions were answered. Patient expressed understanding and wished to proceed. Sterile technique was used throughout procedure. Epidural site was Dressed with sterile barrier dressing. No paresthesias, signs of intravascular injection Or signs of intrathecal spread were encountered.  Patient was more comfortable after the epidural was dosed. Please see RN's note for documentation of vital signs and FHR which are stable.

## 2016-01-14 NOTE — Progress Notes (Signed)
S; notes some painful ctx S/p cytotec x 1  O: pitocin 4 miu VE: 1/50/-3 vtx Intact Foley balloon placed  Tracing: baseline 130 (+) accel Couplets, triplets  IMP: Postdates P) cont pitocin. Left exaggerated sims position

## 2016-01-14 NOTE — Consults (Signed)
  Anesthesia Pain Consult Note  Patient: Morgan Perry, 25 y.o., female  Consult Requested by: Maxie Better, MD  Reason for Consult: CRNA Pain Management rounds Level of Consciousness: alert  Pain: 7 /10 Pain goal 7  Last Vitals:  Filed Vitals:   01/14/16 0832 01/14/16 0905  BP: 124/46 133/76  Pulse: 69 72  Temp:    Resp: 18 18    Plan: Epidural infusion for pain control.  Deuntae Kocsis 01/14/2016

## 2016-01-14 NOTE — H&P (Signed)
Morgan Perry is a 25 y.o. female presenting for IOL @ 40 4/[redacted] weeks gestation due to postdates. Intact membrane. GBS cx neg  Maternal Medical History:  Fetal activity: Perceived fetal activity is normal.    Prenatal complications: no prenatal complications   OB History    Gravida Para Term Preterm AB TAB SAB Ectopic Multiple Living       Past Medical History  Diagnosis Date  . Low lying placenta nos or without hemorrhage, third trimester   . Hx of chlamydia infection   . Medical history non-contributory    Past Surgical History  Procedure Laterality Date  . Bunionectomy      R foot   Family History: family history includes Cancer in her paternal uncle; Down syndrome in her cousin; Hypertension in her maternal grandmother. Social History:  reports that she has never smoked. She does not have any smokeless tobacco history on file. She reports that she drinks about 0.6 oz of alcohol per week. She reports that she does not use illicit drugs.   Prenatal Transfer Tool  Maternal Diabetes: No Genetic Screening: Normal Maternal Ultrasounds/Referrals: Normal Fetal Ultrasounds or other Referrals:  None Maternal Substance Abuse:  No Significant Maternal Medications:  None Significant Maternal Lab Results:  Lab values include: Group B Strep negative Other Comments:  None  Review of Systems  All other systems reviewed and are negative.     Last menstrual period 04/04/2015. Exam Physical Exam  Constitutional: She is oriented to person, place, and time. She appears well-developed and well-nourished.  HENT:  Head: Atraumatic.  Eyes: EOM are normal.  Neck: Neck supple.  Cardiovascular: Regular rhythm.   Respiratory: Effort normal.  GI: Soft.  Musculoskeletal: Normal range of motion.  Neurological: She is alert and oriented to person, place, and time.  Skin: Skin is warm and dry.  Psychiatric: She has a normal mood and affect.    Prenatal labs: ABO, Rh:  O/Positive/-- (07/20 0000) Antibody: Negative (07/20 0000) Rubella: Immune (07/20 0000) RPR: Nonreactive (07/20 0000)  HBsAg: Negative (07/20 0000)  HIV: Non-reactive (07/20 0000)  GBS: Negative (01/18 0000)   Assessment/Plan: Postdates P) admit routine labs. Cytotec. Analgesic/epidural prn. Amniotomy prn. IV pitocin prn   Jeovanny Cuadros A 01/14/2016, 12:34 AM

## 2016-01-14 NOTE — Anesthesia Preprocedure Evaluation (Signed)
Anesthesia Evaluation  Patient identified by MRN, date of birth, ID band Patient awake    Reviewed: Allergy & Precautions, H&P , Patient's Chart, lab work & pertinent test results  Airway Mallampati: II  TM Distance: >3 FB Neck ROM: full    Dental no notable dental hx. (+) Teeth Intact   Pulmonary neg pulmonary ROS,    Pulmonary exam normal breath sounds clear to auscultation       Cardiovascular negative cardio ROS Normal cardiovascular exam Rhythm:regular Rate:Normal     Neuro/Psych negative neurological ROS  negative psych ROS   GI/Hepatic negative GI ROS, Neg liver ROS,   Endo/Other  negative endocrine ROS  Renal/GU negative Renal ROS  negative genitourinary   Musculoskeletal   Abdominal   Peds  Hematology negative hematology ROS (+)   Anesthesia Other Findings   Reproductive/Obstetrics (+) Pregnancy                             Anesthesia Physical Anesthesia Plan  ASA: II  Anesthesia Plan: Epidural   Post-op Pain Management:    Induction:   Airway Management Planned:   Additional Equipment:   Intra-op Plan:   Post-operative Plan:   Informed Consent: I have reviewed the patients History and Physical, chart, labs and discussed the procedure including the risks, benefits and alternatives for the proposed anesthesia with the patient or authorized representative who has indicated his/her understanding and acceptance.     Plan Discussed with: Anesthesiologist  Anesthesia Plan Comments:         Anesthesia Quick Evaluation  

## 2016-01-15 ENCOUNTER — Encounter (HOSPITAL_COMMUNITY): Payer: Self-pay

## 2016-01-15 DIAGNOSIS — O411290 Chorioamnionitis, unspecified trimester, not applicable or unspecified: Secondary | ICD-10-CM | POA: Diagnosis present

## 2016-01-15 LAB — CBC
HCT: 27.9 % — ABNORMAL LOW (ref 36.0–46.0)
Hemoglobin: 9.4 g/dL — ABNORMAL LOW (ref 12.0–15.0)
MCH: 28.7 pg (ref 26.0–34.0)
MCHC: 33.7 g/dL (ref 30.0–36.0)
MCV: 85.3 fL (ref 78.0–100.0)
Platelets: 254 10*3/uL (ref 150–400)
RBC: 3.27 MIL/uL — ABNORMAL LOW (ref 3.87–5.11)
RDW: 14.3 % (ref 11.5–15.5)
WBC: 23.8 10*3/uL — ABNORMAL HIGH (ref 4.0–10.5)

## 2016-01-15 MED ORDER — ONDANSETRON HCL 4 MG PO TABS: 4.0000 mg | ORAL_TABLET | ORAL | Status: DC | PRN

## 2016-01-15 MED ORDER — ZOLPIDEM TARTRATE 5 MG PO TABS: 5.0000 mg | ORAL_TABLET | Freq: Every evening | ORAL | Status: DC | PRN

## 2016-01-15 MED ORDER — METHYLERGONOVINE MALEATE 0.2 MG/ML IJ SOLN: 0.2000 mg | INTRAMUSCULAR | Status: DC | PRN

## 2016-01-15 MED ORDER — ACETAMINOPHEN 325 MG PO TABS
325.0000 mg | ORAL_TABLET | Freq: Once | ORAL | Status: AC
  Administered 2016-01-15: 325 mg via ORAL

## 2016-01-15 MED ORDER — SODIUM CHLORIDE 0.9% FLUSH
3.0000 mL | INTRAVENOUS | Status: DC | PRN
  Administered 2016-01-15: 3 mL via INTRAVENOUS

## 2016-01-15 MED ORDER — FERROUS SULFATE 325 (65 FE) MG PO TABS
325.0000 mg | ORAL_TABLET | Freq: Two times a day (BID) | ORAL | Status: DC
  Administered 2016-01-15 – 2016-01-17 (×3): 325 mg via ORAL
  Filled 2016-01-15 (×4): qty 1

## 2016-01-15 MED ORDER — BENZOCAINE-MENTHOL 20-0.5 % EX AERO
1.0000 "application " | INHALATION_SPRAY | CUTANEOUS | Status: DC | PRN
  Administered 2016-01-15: 1 via TOPICAL
  Filled 2016-01-15 (×2): qty 56

## 2016-01-15 MED ORDER — VANCOMYCIN HCL IN DEXTROSE 1-5 GM/200ML-% IV SOLN
1000.0000 mg | Freq: Two times a day (BID) | INTRAVENOUS | Status: DC
  Filled 2016-01-15: qty 200

## 2016-01-15 MED ORDER — METHYLERGONOVINE MALEATE 0.2 MG PO TABS: 0.2000 mg | ORAL_TABLET | ORAL | Status: DC | PRN

## 2016-01-15 MED ORDER — WITCH HAZEL-GLYCERIN EX PADS
1.0000 "application " | MEDICATED_PAD | CUTANEOUS | Status: DC | PRN
  Administered 2016-01-15: 1 via TOPICAL

## 2016-01-15 MED ORDER — GENTAMICIN SULFATE 40 MG/ML IJ SOLN
170.0000 mg | Freq: Three times a day (TID) | INTRAVENOUS | Status: DC
  Administered 2016-01-15 – 2016-01-16 (×3): 170 mg via INTRAVENOUS
  Filled 2016-01-15 (×4): qty 4.25

## 2016-01-15 MED ORDER — LANOLIN HYDROUS EX OINT: TOPICAL_OINTMENT | CUTANEOUS | Status: DC | PRN

## 2016-01-15 MED ORDER — GENTAMICIN SULFATE 40 MG/ML IJ SOLN
180.0000 mg | Freq: Once | INTRAVENOUS | Status: AC
  Administered 2016-01-15: 180 mg via INTRAVENOUS
  Filled 2016-01-15: qty 4.5

## 2016-01-15 MED ORDER — DIBUCAINE 1 % RE OINT
1.0000 "application " | TOPICAL_OINTMENT | RECTAL | Status: DC | PRN
  Administered 2016-01-15: 1 via RECTAL
  Filled 2016-01-15 (×2): qty 28

## 2016-01-15 MED ORDER — OXYCODONE-ACETAMINOPHEN 5-325 MG PO TABS: 2.0000 | ORAL_TABLET | ORAL | Status: DC | PRN

## 2016-01-15 MED ORDER — VANCOMYCIN HCL IN DEXTROSE 1-5 GM/200ML-% IV SOLN
1000.0000 mg | Freq: Two times a day (BID) | INTRAVENOUS | Status: DC
  Administered 2016-01-15 – 2016-01-16 (×3): 1000 mg via INTRAVENOUS
  Filled 2016-01-15 (×4): qty 200

## 2016-01-15 MED ORDER — MISOPROSTOL 200 MCG PO TABS
ORAL_TABLET | ORAL | Status: AC
  Administered 2016-01-15: 800 ug
  Filled 2016-01-15: qty 4

## 2016-01-15 MED ORDER — PRENATAL MULTIVITAMIN CH
1.0000 | ORAL_TABLET | Freq: Every day | ORAL | Status: DC
  Administered 2016-01-15 – 2016-01-17 (×3): 1 via ORAL
  Filled 2016-01-15 (×3): qty 1

## 2016-01-15 MED ORDER — OXYCODONE-ACETAMINOPHEN 5-325 MG PO TABS: 1.0000 | ORAL_TABLET | ORAL | Status: DC | PRN

## 2016-01-15 MED ORDER — DIPHENHYDRAMINE HCL 25 MG PO CAPS: 25.0000 mg | ORAL_CAPSULE | Freq: Four times a day (QID) | ORAL | Status: DC | PRN

## 2016-01-15 MED ORDER — ACETAMINOPHEN 500 MG PO TABS
1000.0000 mg | ORAL_TABLET | Freq: Once | ORAL | Status: AC
Start: 2016-01-15 — End: 2016-01-15
  Administered 2016-01-15: 1000 mg via ORAL
  Filled 2016-01-15: qty 2

## 2016-01-15 MED ORDER — ONDANSETRON HCL 4 MG/2ML IJ SOLN: 4.0000 mg | INTRAMUSCULAR | Status: DC | PRN

## 2016-01-15 MED ORDER — SENNOSIDES-DOCUSATE SODIUM 8.6-50 MG PO TABS
2.0000 | ORAL_TABLET | ORAL | Status: DC
  Administered 2016-01-15 – 2016-01-17 (×2): 2 via ORAL
  Filled 2016-01-15 (×2): qty 2

## 2016-01-15 MED ORDER — SIMETHICONE 80 MG PO CHEW
80.0000 mg | CHEWABLE_TABLET | ORAL | Status: DC | PRN
  Administered 2016-01-16: 80 mg via ORAL
  Filled 2016-01-15: qty 1

## 2016-01-15 MED ORDER — IBUPROFEN 600 MG PO TABS
600.0000 mg | ORAL_TABLET | Freq: Four times a day (QID) | ORAL | Status: DC
  Administered 2016-01-15 – 2016-01-17 (×9): 600 mg via ORAL
  Filled 2016-01-15 (×9): qty 1

## 2016-01-15 NOTE — Progress Notes (Signed)
S: occ rectal pressure  O: temp 100.7( max) down to 100.2 Pitocin Epidural On Vanc/Gent VE fully +1 per RN Tracing: baseline 140 (+) accel  Small variables Ctx q 2-32mins  IMP: postdates Presumed chorioamnionitis on Vanc/gent P) laboring vtx down. Cont with antibiotics

## 2016-01-15 NOTE — Progress Notes (Signed)
Patient ID: Morgan Perry, female   DOB: 05/20/1991, 25 y.o.   MRN: [id number] INTERVAL NOTE:  S:   Laying in bed trying to get some rest, min cramping, (+) voids, small bleed, denies HA/NV/dizziness  O:    Today's Vitals   01/15/16 0700 01/15/16 0721 01/15/16 0841 01/15/16 0929  BP: 147/75  130/68 131/78  Pulse: 129  115 97  Temp: 103.5 F (39.7 C) 102.8 F (39.3 C) 101.1 F (38.4 C) 99.4 F (37.4 C)  TempSrc: Axillary Axillary Oral   Resp: Height:      Weight:      SpO2:    100%  PainSc: 0-No pain       AAO x 3, NAD  U-even  Scant lochia  A / P:   PPD #0  Stable post partum  Presumed Chorioamnionitis - temps trending down, stable  Continue Vancomycin/Gentamycin therapy per pharmacy protocol  Routine PP orders  Kenard Gower, MSN, CNM 01/15/2016, 9:41 AM

## 2016-01-15 NOTE — Progress Notes (Signed)
Patient assisted with hand expression and set up with double electric breast pump. Encouraged to pump every 3 hours for 15 minutes. Colostrum easily expressed.No colostrum obtained with Breast pump.

## 2016-01-15 NOTE — Progress Notes (Addendum)
ANTIBIOTIC CONSULT NOTE - INITIAL  Pharmacy Consult for Gentamicin and Vancomycin Indication: Maternal temperature  Allergies  Allergen Reactions  . Amoxicillin Other (See Comments)    Childhood reaction    Patient Measurements: Height:  (170.2 cm) Weight: 161 lb (73.029 kg) IBW/kg (Calculated) : 61.6 Adjusted Body Weight: 65  Vital Signs: Temp: 100.7 F (38.2 C) (02/27 0030) Temp Source: Axillary (02/27 0030) BP: 147/67 mmHg (02/27 0030) Pulse Rate: 112 (02/27 0030)  Labs:  Recent Labs  01/14/16 0033 01/14/16 1850  WBC 7.3 11.2*  HGB 12.5 12.3  PLT 280 275   No results for input(s): GENTTROUGH, GENTPEAK, GENTRANDOM in the last 72 hours.   Microbiology: No results found for this or any previous visit (from the past 720 hour(s)).  Medications:   Assessment: 25 y.o. female G1P0000 at [redacted]w[redacted]d with increased temperature in active labor. Estimated Gentamicin Ke = 0.412, Vd = 26 Liters Estimated Vancomycin Ke= 0.7,  Vd =  50 Liters  Goal of Therapy:  Gentamicin peak 6-8 mg/L and Trough < 1 mg/L Vancomycin troughs 15-20 mg/L  Plan:  Vancomycin 1 Gm IV every 12 hours Gentamicin 180 mg IV x 1  Gentamicin 170 mg IV every 8 hrs Check Scr with next labs if gentamicin continued. Will check gentamicin and/or vancomycin levels if continued > 72hr or clinically indicated.  Arelia Sneddon 01/15/2016,1:04 AM

## 2016-01-15 NOTE — Lactation Note (Signed)
This note was copied from a baby's chart. Lactation Consultation Note Initial visit at 12 hours of age.  Mom was previously visiting with baby in NICU.  Mom has DEBP at bedside and reports a little pain with pumping.  Mom reports hand expressing a few drops and already brought to NICU.  Reviewed instructions on DEBP set up cleaning and frequency.  #24 flange appears to be a good fit and discussed using #27 if she continues to have pain with pumping.  # dots given to mom to put on EBM as she collects.  Reviewed NICU booklet for feeding baby.  Mom denies further concerns at this time.   Patient Name: Morgan Perry Today's Date: 01/15/2016 Reason for consult: Initial assessment;NICU baby   Maternal Data Has patient been taught Hand Expression?: Yes Does the patient have breastfeeding experience prior to this delivery?: No  Feeding    LATCH Score/Interventions                      Lactation Tools Discussed/Used Pump Review: Setup, frequency, and cleaning Initiated by:: JS reviewed set up already in room  Date initiated:: 01/15/16   Consult Status Consult Status: Follow-up Date: 01/16/16 Follow-up type: In-patient    Telford Archambeau, Arvella Merles 01/15/2016, 6:18 PM

## 2016-01-15 NOTE — Anesthesia Postprocedure Evaluation (Signed)
Anesthesia Post Note  Patient: Morgan Perry  Procedure(s) Performed: * No procedures listed *  Patient location during evaluation: Mother Baby Anesthesia Type: Epidural Level of consciousness: awake, awake and alert, oriented and patient cooperative Pain management: pain level controlled Vital Signs Assessment: post-procedure vital signs reviewed and stable Respiratory status: spontaneous breathing, nonlabored ventilation and respiratory function stable Cardiovascular status: stable Postop Assessment: no headache, no backache, patient able to bend at knees and no signs of nausea or vomiting Anesthetic complications: no    Last Vitals:  Filed Vitals:   01/15/16 0929 01/15/16 1148  BP: 131/78 137/78  Pulse: 97 107  Temp: 37.4 C 36.9 C  Resp: 20 18    Last Pain:  Filed Vitals:   01/15/16 1208  PainSc: 0-No pain                 [name] L

## 2016-01-16 LAB — CBC
HCT: 21.5 % — ABNORMAL LOW (ref 36.0–46.0)
Hemoglobin: 7.4 g/dL — ABNORMAL LOW (ref 12.0–15.0)
MCH: 29.1 pg (ref 26.0–34.0)
MCHC: 34.4 g/dL (ref 30.0–36.0)
MCV: 84.6 fL (ref 78.0–100.0)
Platelets: 231 10*3/uL (ref 150–400)
RBC: 2.54 MIL/uL — ABNORMAL LOW (ref 3.87–5.11)
RDW: 14.5 % (ref 11.5–15.5)
WBC: 18.7 10*3/uL — ABNORMAL HIGH (ref 4.0–10.5)

## 2016-01-16 MED ORDER — DOCUSATE SODIUM 100 MG PO CAPS
200.0000 mg | ORAL_CAPSULE | Freq: Two times a day (BID) | ORAL | Status: DC
  Administered 2016-01-16 – 2016-01-17 (×3): 200 mg via ORAL
  Filled 2016-01-16 (×3): qty 2

## 2016-01-16 NOTE — Lactation Note (Signed)
This note was copied from a baby's chart. Lactation Consultation Note  Patient Name: Morgan Perry [id number] Date: 01/16/2016 Reason for consult: Follow-up assessment  With this mom of a term baby, in NICU, now 52 hours old. Mom has a sore left nipple, - scab around the top part of the base of nipple. The 24 flanges were p8ulling her areola, so I decreased her to 21 flanges, with a better fit. I did caution mom to increase back to 24  Flanges if 21 feel at all too tight. Mom is beginning to transition into mature milk. I showed mom how to set initiation setting on pump, and advised that she switch to maintenance setting once she expressed 15-20 ml's . Mom encouraged to increase her frequency of pumping to at least every 3 hours, and to begin putting her baby to breast diruing feeding times, in the NICU. Mom has a DEP at home. Mom will call for questions/concerns.    Maternal Data    Feeding Feeding Type: Formula Nipple Type: Slow - flow Length of feed: 15 min  LATCH Score/Interventions                      Lactation Tools Discussed/Used Pump Review: Setup, frequency, and cleaning;Milk Storage;Other (comment) (hand expression revicewed, dcreased to 21 flanges with a better fit, cautioned to increase to 24 if feel too tight when her milk comes in)   Consult Status Consult Status: Follow-up Date: 01/17/16 Follow-up type:  (NICU and mom's room)    Alfred Levins 01/16/2016, 3:42 PM

## 2016-01-16 NOTE — Progress Notes (Addendum)
PPD 1 SVD  S:  Reports feeling ok             Tolerating po/ No nausea or vomiting             Bleeding is light             Pain controlled with motrin             Up ad lib / ambulatory / voiding QS  Newborn breast feeding plan - stable in NICU - pumping with lactation assist this am  O:               VS: BP 101/50 mmHg  Pulse 78  Temp(Src) 97.7 F (36.5 C) (Oral)  Resp 18  Ht  (1.702 m)  Wt 73.029 kg (161 lb)  BMI 25.21 kg/m2  SpO2 100%  LMP 04/04/2015  Breastfeeding? Unknown   LABS:              Recent Labs  01/15/16 0933 01/16/16 0636  WBC 23.8* 18.7*  HGB 9.4* 7.4*  PLT 254 231               Blood type: O/Positive/-- (07/20 0000)  Rubella: Immune (07/20 0000)                     I&O: Intake/Output      02/27 0701 - 02/28 0700 02/28 0701 - 03/01 0700   P.O. 2280    I.V. (mL/kg) 50 (0.7)    IV Piggyback 504.3    Total Intake(mL/kg) 2834.3 (38.8)    Urine (mL/kg/hr) 3550 (2)    Emesis/NG output     Blood     Total Output 3550     Net -715.8                        Physical Exam:             Alert and oriented X3  Abdomen: soft, non-tender, non-distended              Fundus: firm, non-tender, Ueven  Perineum: ice pack in place  Lochia: light  Extremities: no edema, no calf pain or tenderness    A: PPD # 1             IDA of pregnancy with ABL compounding             Intrapartum presumed chorio - afebrile > 24hr  Doing well - stable status  P: Routine post partum orders - ok to DC ABX  Iron and magnesium x 8 weeks with PNV             DC tomorrow  Marlinda Mike CNM, MSN, [facility] 01/16/2016, 8:24 AM

## 2016-01-16 NOTE — Clinical Social Work Maternal (Signed)
CLINICAL SOCIAL WORK MATERNAL/CHILD NOTE  Patient Details  Name: Morgan Perry MRN: [id number] Date of Birth: 01-02-1991  Date:  01/16/2016  Clinical Social Worker Initiating Note:  Tayonna Bacha E. Brigitte Pulse, [place] Date/ Time Initiated:  01/16/16/1400     Child's Name:  Bailee Iran   Legal Guardian:   (Parents: Thomasene Ripple and Bryant Iran)   Need for Interpreter:  None   Date of Referral:        Reason for Referral:   (No referral-NICU admission)   Referral Source:      Address:  [address], [place], [place] [address]  Phone number:  [phone]   Household Members:      Natural Supports (not living in the home):  Immediate Family, Friends, Extended Family (Parents report that they have a good support system, though their parents do not live locally.)   Professional Supports: None   Employment: Full-time   Type of Work:  (MOB works as a Quarry manager at [employer].  FOB is a Biomedical scientist at [employer].)   Education:      Museum/gallery curator Resources:  Multimedia programmer   Other Resources:      Cultural/Religious Considerations Which May Impact Care: None stated.  MOB's facesheet notes religion as [place].  Strengths:  Ability to meet basic needs , Pediatrician chosen , Understanding of illness, Compliance with medical plan , Home prepared for child  (Pediatric follow up will be at [facility] with Dr. Sheran Lawless.)   Risk Factors/Current Problems:  None   Cognitive State:  Alert , Able to Concentrate , Linear Thinking , Insightful , Goal Oriented    Mood/Affect:  Calm , Comfortable , Tearful , Interested    CSW Assessment: CSW met with parents in MOB's first floor room to introduce services, offer support, and complete assessment due to baby's admission to NICU at 40.6 weeks.  Parents were extremely pleasant, welcoming of CSW's visit and easy to engage.  MOB's brother and cousin were visiting and MOB stated this was a good time to talk. Parents appear to  have a good understanding of NICU admission reason and state that baby is currently doing well.  MOB commented that they are watching her oxygen for 7 days, which prompted CSW to ask if baby was started on antibiotics as "7 days" is typically more associated with an antibiotic course than with oxygen needs.  Parents report that she is on antibiotics.  FOB asked if there is any case where baby will be able to leave sooner than 7 days.  CSW explained that CSW is not aware of what the determined course is, but that if they have determined that she will need 7 days of antibiotics, she will need to remain in the hospital for all 7 days.  CSW explained that oxygen requirement is constantly being assessed and that she may or may not be off oxygen within 7 days.  Parents stated understanding.  CSW encouraged them to continue asking their questions when they visit baby in NICU.  Parents state they feel comfortable with the care she is receiving and feel they are being well updated. MOB was tearful when she spoke about how she is coping emotionally with baby's admission to NICU.  CSW validated, normalized and encouraged emotion related to this experience as well as having a baby in general.  CSW provided education regarding signs and symptoms of PPD and stressed the importance of talking with CSW and or an MD if they have concerns at  any time.  Both parents were very attentive to information given.  CSW encouraged parents to remember that baby's hospitalization is both necessary and temporary, though it is very unnatural to be separated from their baby. Parents report that they have a good support system.  They currently live together and this is the first baby for both of them.  MGM is here visiting with them, but lives in [place], [place].  [place] lives in [place].  Parents report having all needed baby items ready at home. CSW explained ongoing support services offered by NICU CSW and gave contact information, thanking  parents for sharing with CSW today.  Parents seemed appreciative of the visit and for CSW's concern for their emotional wellbeing.  CSW has no social concerns at this time.  CSW Plan/Description:  Patient/Family Education , Psychosocial Support and Ongoing Assessment of Needs    Alphonzo Cruise, [place] 01/16/2016, 3:05 PM

## 2016-01-16 NOTE — Progress Notes (Signed)
CSW attempted to meet with parents in MOB's first floor room to introduce services, offer support, and complete assessment due to baby's admission to NICU, but they were not present at this time.  CSW will attempt again at a later time.

## 2016-01-16 NOTE — Progress Notes (Signed)
Pt c/o mild chest pain when walking back from NICU. She stated that it would come and go. She stated she had no shortness of breath or other symptoms. I asked if she was passing gas, she stated that she was and that it felt like it may be gas pain. I gave her a simethicone per prn orders. Vital signs stable. Instructed other symptoms to report. Will continue to monitor closely.

## 2016-01-17 DIAGNOSIS — D62 Acute posthemorrhagic anemia: Secondary | ICD-10-CM | POA: Diagnosis not present

## 2016-01-17 MED ORDER — MAGNESIUM OXIDE -MG SUPPLEMENT 200 MG PO TABS: 200.0000 mg | ORAL_TABLET | Freq: Every day | ORAL | Status: DC

## 2016-01-17 MED ORDER — FERROUS SULFATE 325 (65 FE) MG PO TABS: 325.0000 mg | ORAL_TABLET | Freq: Two times a day (BID) | ORAL | Status: DC

## 2016-01-17 MED ORDER — IBUPROFEN 600 MG PO TABS: 600.0000 mg | ORAL_TABLET | Freq: Four times a day (QID) | ORAL | Status: DC

## 2016-01-17 NOTE — Discharge Instructions (Signed)
Breast Pumping Tips °If you are breastfeeding, there may be times when you cannot feed your baby directly. Returning to work or going on a trip are common examples. Pumping allows you to store breast milk and feed it to your baby later.  °You may not get much milk when you first start to pump. Your breasts should start to make more after a few days. If you pump at the times you usually feed your baby, you may be able to keep making enough milk to feed your baby without also using formula. The more often you pump, the more milk you will produce.  °WHEN SHOULD I PUMP?  °· You can begin to pump soon after delivery. However, some experts recommend waiting about 4 weeks before giving your infant a bottle to make sure breastfeeding is going well.  °· If you plan to return to work, begin pumping a few weeks before. This will help you develop techniques that work best for you. It also lets you build up a supply of breast milk.   °· When you are with your infant, feed on demand and pump after each feeding.   °· When you are away from your infant for several hours, pump for about 15 minutes every 2-3 hours. Pump both breasts at the same time if you can.   °· If your infant has a formula feeding, make sure to pump around the same time.     °· If you drink any alcohol, wait 2 hours before pumping.   °HOW DO I PREPARE TO PUMP? °Your let-down reflex is the natural reaction to stimulation that makes your breast milk flow. It is easier to stimulate this reflex when you are relaxed. Find relaxation techniques that work for you. If you have difficulty with your let-down reflex, try these methods:  °· Smell one of your infant's blankets or an item of clothing.   °· Look at a picture or video of your infant.   °· Sit in a quiet, private space.   °· Massage the breast you plan to pump.   °· Place soothing warmth on the breast.   °· Play relaxing music.   °WHAT ARE SOME GENERAL BREAST PUMPING TIPS? °· Wash your hands before you pump. You  do not need to wash your nipples or breasts. °· There are three ways to pump. °¨ You can use your hand to massage and compress your breast. °¨ You can use a handheld manual pump. °¨ You can use an electric pump.   °· Make sure the suction cup (flange) on the breast pump is the right size. Place the flange directly over the nipple. If it is the wrong size or placed the wrong way, it may be painful and cause nipple damage.   °· If pumping is uncomfortable, apply a small amount of purified or modified lanolin to your nipple and areola. °· If you are using an electric pump, adjust the speed and suction power to be more comfortable. °· If pumping is painful or if you are not getting very much milk, you may need a different type of pump. A lactation consultant can help you determine what type of pump to use.   °· Keep a full water bottle near you at all times. Drinking lots of fluid helps you make more milk.  °· You can store your milk to use later. Pumped breast milk can be stored in a sealable, sterile container or plastic bag. Label all stored breast milk with the date you pumped it. °¨ Milk can stay out at room temperature for up to 8 hours. °¨   You can store your milk in the refrigerator for up to 8 days. °¨ You can store your milk in the freezer for 3 months. Thaw frozen milk using warm water. Do not put it in the microwave. °· Do not smoke. Smoking can lower your milk supply and harm your infant. If you need help quitting, ask your health care provider to recommend a program.   °WHEN SHOULD I CALL MY HEALTH CARE PROVIDER OR A LACTATION CONSULTANT? °· You are having trouble pumping. °· You are concerned that you are not making enough milk. °· You have nipple pain, soreness, or redness. °· You want to use birth control. Birth control pills may lower your milk supply. Talk to your health care provider about your options. °  °This information is not intended to replace advice given to you by your health care provider.  Make sure you discuss any questions you have with your health care provider. °  °Document Released: 04/24/2010 Document Revised: 11/09/2013 Document Reviewed: 08/27/2013 °[employer]vier Interactive Patient Education ©2016 [employer] °Postpartum Depression and Baby Blues °The postpartum period begins right after the birth of a baby. During this time, there is often a great amount of joy and excitement. It is also a time of many changes in the life of the parents. Regardless of how many times a mother gives birth, each child brings new challenges and dynamics to the family. It is not unusual to have feelings of excitement along with confusing shifts in moods, emotions, and thoughts. All mothers are at risk of developing postpartum depression or the "baby blues." These mood changes can occur right after giving birth, or they may occur many months after giving birth. The baby blues or postpartum depression can be mild or severe. Additionally, postpartum depression can go away rather quickly, or it can be a long-term condition.  °CAUSES °Raised hormone levels and the rapid drop in those levels are thought to be a main cause of postpartum depression and the baby blues. A number of hormones change during and after pregnancy. Estrogen and progesterone usually decrease right after the delivery of your baby. The levels of thyroid hormone and various cortisol steroids also rapidly drop. Other factors that play a role in these mood changes include major life events and genetics.  °RISK FACTORS °If you have any of the following risks for the baby blues or postpartum depression, know what symptoms to watch out for during the postpartum period. Risk factors that may increase the likelihood of getting the baby blues or postpartum depression include: °· Having a personal or family history of depression.   °· Having depression while being pregnant.   °· Having premenstrual mood issues or mood issues related to oral  contraceptives. °· Having a lot of life stress.   °· Having marital conflict.   °· Lacking a social support network.   °· Having a baby with special needs.   °· Having health problems, such as diabetes.   °SIGNS AND SYMPTOMS °Symptoms of baby blues include: °· Brief changes in mood, such as going from extreme happiness to sadness. °· Decreased concentration.   °· Difficulty sleeping.   °· Crying spells, tearfulness.   °· Irritability.   °· Anxiety.   °Symptoms of postpartum depression typically begin within the first month after giving birth. These symptoms include: °· Difficulty sleeping or excessive sleepiness.   °· Marked weight loss.   °· Agitation.   °· Feelings of worthlessness.   °· Lack of interest in activity or food.   °Postpartum psychosis is a very serious condition and can be dangerous. Fortunately, it is   rare. Displaying any of the following symptoms is cause for immediate medical attention. Symptoms of postpartum psychosis include:  °· Hallucinations and delusions.   °· Bizarre or disorganized behavior.   °· Confusion or disorientation.   °DIAGNOSIS  °A diagnosis is made by an evaluation of your symptoms. There are no medical or lab tests that lead to a diagnosis, but there are various questionnaires that a health care provider may use to identify those with the baby blues, postpartum depression, or psychosis. Often, a screening tool called the Edinburgh Postnatal Depression Scale is used to diagnose depression in the postpartum period.  °TREATMENT °The baby blues usually goes away on its own in 1-2 weeks. Social support is often all that is needed. You will be encouraged to get adequate sleep and rest. Occasionally, you may be given medicines to help you sleep.  °Postpartum depression requires treatment because it can last several months or longer if it is not treated. Treatment may include individual or group therapy, medicine, or both to address any social, physiological, and psychological factors  that may play a role in the depression. Regular exercise, a healthy diet, rest, and social support may also be strongly recommended.  °Postpartum psychosis is more serious and needs treatment right away. Hospitalization is often needed. °HOME CARE INSTRUCTIONS °· Get as much rest as you can. Nap when the baby sleeps.   °· Exercise regularly. Some women find yoga and walking to be beneficial.   °· Eat a balanced and nourishing diet.   °· Do little things that you enjoy. Have a cup of tea, take a bubble bath, read your favorite magazine, or listen to your favorite music. °· Avoid alcohol.   °· Ask for help with household chores, cooking, grocery shopping, or running errands as needed. Do not try to do everything.   °· Talk to people close to you about how you are feeling. Get support from your partner, family members, friends, or other new moms. °· Try to stay positive in how you think. Think about the things you are grateful for.   °· Do not spend a lot of time alone.   °· Only take over-the-counter or prescription medicine as directed by your health care provider. °· Keep all your postpartum appointments.   °· Let your health care provider know if you have any concerns.   °SEEK MEDICAL CARE IF: °You are having a reaction to or problems with your medicine. °SEEK IMMEDIATE MEDICAL CARE IF: °· You have suicidal feelings.   °· You think you may harm the baby or someone else. °MAKE SURE YOU: °· Understand these instructions. °· Will watch your condition. °· Will get help right away if you are not doing well or get worse. °  °This information is not intended to replace advice given to you by your health care provider. Make sure you discuss any questions you have with your health care provider. °  °Document Released: 08/08/2004 Document Revised: 11/09/2013 Document Reviewed: 08/16/2013 °Elsevier Interactive Patient Education ©2016 [employer] °Postpartum Care After Vaginal Delivery °After you deliver your newborn  (postpartum period), the usual stay in the hospital is 24-72 hours. If there were problems with your labor or delivery, or if you have other medical problems, you might be in the hospital longer.  °While you are in the hospital, you will receive help and instructions on how to care for yourself and your newborn during the postpartum period.  °While you are in the hospital: °· Be sure to tell your nurses if you have pain or discomfort, as well as   where you feel the pain and what makes the pain worse. °· If you had an incision made near your vagina (episiotomy) or if you had some tearing during delivery, the nurses may put ice packs on your episiotomy or tear. The ice packs may help to reduce the pain and swelling. °· If you are breastfeeding, you may feel uncomfortable contractions of your uterus for a couple of weeks. This is normal. The contractions help your uterus get back to normal size. °· It is normal to have some bleeding after delivery. °¨ For the first 1-3 days after delivery, the flow is red and the amount may be similar to a period. °¨ It is common for the flow to start and stop. °¨ In the first few days, you may pass some small clots. Let your nurses know if you begin to pass large clots or your flow increases. °¨ Do not  flush blood clots down the toilet before having the nurse look at them. °¨ During the next 3-10 days after delivery, your flow should become more watery and pink or brown-tinged in color. °¨ Ten to fourteen days after delivery, your flow should be a small amount of yellowish-white discharge. °¨ The amount of your flow will decrease over the first few weeks after delivery. Your flow may stop in 6-8 weeks. Most women have had their flow stop by 12 weeks after delivery. °· You should change your sanitary pads frequently. °· Wash your hands thoroughly with soap and water for at least 20 seconds after changing pads, using the toilet, or before holding or feeding your newborn. °· You should  feel like you need to empty your bladder within the first 6-8 hours after delivery. °· In case you become weak, lightheaded, or faint, call your nurse before you get out of bed for the first time and before you take a shower for the first time. °· Within the first few days after delivery, your breasts may begin to feel tender and full. This is called engorgement. Breast tenderness usually goes away within 48-72 hours after engorgement occurs. You may also notice milk leaking from your breasts. If you are not breastfeeding, do not stimulate your breasts. Breast stimulation can make your breasts produce more milk. °· Spending as much time as possible with your newborn is very important. During this time, you and your newborn can feel close and get to know each other. Having your newborn stay in your room (rooming in) will help to strengthen the bond with your newborn.  It will give you time to get to know your newborn and become comfortable caring for your newborn. °· Your hormones change after delivery. Sometimes the hormone changes can temporarily cause you to feel sad or tearful. These feelings should not last more than a few days. If these feelings last longer than that, you should talk to your caregiver. °· If desired, talk to your caregiver about methods of family planning or contraception. °· Talk to your caregiver about immunizations. Your caregiver may want you to have the following immunizations before leaving the hospital: °¨ Tetanus, diphtheria, and pertussis (Tdap) or tetanus and diphtheria (Td) immunization. It is very important that you and your family (including grandparents) or others caring for your newborn are up-to-date with the Tdap or Td immunizations. The Tdap or Td immunization can help protect your newborn from getting ill. °¨ Rubella immunization. °¨ Varicella (chickenpox) immunization. °¨ Influenza immunization. You should receive this annual immunization if you did not receive the    immunization during your pregnancy. °  °This information is not intended to replace advice given to you by your health care provider. Make sure you discuss any questions you have with your health care provider. °  °Document Released: 09/01/2007 Document Revised: 07/29/2012 Document Reviewed: 07/01/2012 °[employer]vier Interactive Patient Education ©2016 [employer] °Breastfeeding and Mastitis °Mastitis is inflammation of the breast tissue. It can occur in women who are breastfeeding. This can make breastfeeding painful. Mastitis will sometimes go away on its own. Your health care provider will help determine if treatment is needed. °CAUSES °Mastitis is often associated with a blocked milk (lactiferous) duct. This can happen when too much milk builds up in the breast. Causes of excess milk in the breast can include: °· Poor latch-on. If your baby is not latched onto the breast properly, she or he may not empty your breast completely while breastfeeding. °· Allowing too much time to pass between feedings. °· Wearing a bra or other clothing that is too tight. This puts extra pressure on the lactiferous ducts so milk does not flow through them as it should. °Mastitis can also be caused by a bacterial infection. Bacteria may enter the breast tissue through cuts or openings in the skin. In women who are breastfeeding, this may occur because of cracked or irritated skin. Cracks in the skin are often caused when your baby does not latch on properly to the breast. °SIGNS AND SYMPTOMS °· Swelling, redness, tenderness, and pain in an area of the breast. °· Swelling of the glands under the arm on the same side. °· Fever may or may not accompany mastitis. °If an infection is allowed to progress, a collection of pus (abscess) may develop. °DIAGNOSIS  °Your health care provider can usually diagnose mastitis based on your symptoms and a physical exam. Tests may be done to help confirm the diagnosis. These may include: °· Removal of pus  from the breast by applying pressure to the area. This pus can be examined in the lab to determine which bacteria are present. If an abscess has developed, the fluid in the abscess can be removed with a needle. This can also be used to confirm the diagnosis and determine the bacteria present. In most cases, pus will not be present. °· Blood tests to determine if your body is fighting a bacterial infection. °· Mammogram or ultrasound tests to rule out other problems or diseases. °TREATMENT  °Mastitis that occurs with breastfeeding will sometimes go away on its own. Your health care provider may choose to wait 24 hours after first seeing you to decide whether a prescription medicine is needed. If your symptoms are worse after 24 hours, your health care provider will likely prescribe an antibiotic medicine to treat the mastitis. He or she will determine which bacteria are most likely causing the infection and will then select an appropriate antibiotic medicine. This is sometimes changed based on the results of tests performed to identify the bacteria, or if there is no response to the antibiotic medicine selected. Antibiotic medicines are usually given by mouth. You may also be given medicine for pain. °HOME CARE INSTRUCTIONS °· Only take over-the-counter or prescription medicines for pain, fever, or discomfort as directed by your health care provider. °· If your health care provider prescribed an antibiotic medicine, take the medicine as directed. Make sure you finish it even if you start to feel better. °· Do not wear a tight or underwire bra. Wear a soft, supportive bra. °· Increase your fluid   intake, especially if you have a fever. °· Continue to empty the breast. Your health care provider can tell you whether this milk is safe for your infant or needs to be thrown out. You may be told to stop nursing until your health care provider thinks it is safe for your baby. Use a breast pump if you are advised to stop  nursing. °· Keep your nipples clean and dry. °· Empty the first breast completely before going to the other breast. If your baby is not emptying your breasts completely for some reason, use a breast pump to empty your breasts. °· If you go back to work, pump your breasts while at work to stay in time with your nursing schedule. °· Avoid allowing your breasts to become overly filled with milk (engorged). °SEEK MEDICAL CARE IF: °· You have pus-like discharge from the breast. °· Your symptoms do not improve with the treatment prescribed by your health care provider within 2 days. °SEEK IMMEDIATE MEDICAL CARE IF: °· Your pain and swelling are getting worse. °· You have pain that is not controlled with medicine. °· You have a red line extending from the breast toward your armpit. °· You have a fever or persistent symptoms for more than 2-3 days. °· You have a fever and your symptoms suddenly get worse. °MAKE SURE YOU:  °· Understand these instructions. °· Will watch your condition. °· Will get help right away if you are not doing well or get worse. °  °This information is not intended to replace advice given to you by your health care provider. Make sure you discuss any questions you have with your health care provider. °  °Document Released: 03/01/2005 Document Revised: 11/09/2013 Document Reviewed: 06/10/2013 °[employer]vier Interactive Patient Education ©2016 [employer] ° °Breastfeeding °Deciding to breastfeed is one of the best choices you can make for you and your baby. A change in hormones during pregnancy causes your breast tissue to grow and increases the number and size of your milk ducts. These hormones also allow proteins, sugars, and fats from your blood supply to make breast milk in your milk-producing glands. Hormones prevent breast milk from being released before your baby is born as well as prompt milk flow after birth. Once breastfeeding has begun, thoughts of your baby, as well as his or her sucking or  crying, can stimulate the release of milk from your milk-producing glands.  °BENEFITS OF BREASTFEEDING °For Your Baby °· Your first milk (colostrum) helps your baby's digestive system function better. °· There are antibodies in your milk that help your baby fight off infections. °· Your baby has a lower incidence of asthma, allergies, and sudden infant death syndrome. °· The nutrients in breast milk are better for your baby than infant formulas and are designed uniquely for your baby's needs. °· Breast milk improves your baby's brain development. °· Your baby is less likely to develop other conditions, such as childhood obesity, asthma, or type 2 diabetes mellitus. °For You °· Breastfeeding helps to create a very special bond between you and your baby. °· Breastfeeding is convenient. Breast milk is always available at the correct temperature and costs nothing. °· Breastfeeding helps to burn calories and helps you lose the weight gained during pregnancy. °· Breastfeeding makes your uterus contract to its prepregnancy size faster and slows bleeding (lochia) after you give birth.   °· Breastfeeding helps to lower your risk of developing type 2 diabetes mellitus, osteoporosis, and breast or ovarian cancer later in life. °  SIGNS THAT YOUR BABY IS HUNGRY °Early Signs of Hunger °· Increased alertness or activity. °· Stretching. °· Movement of the head from side to side. °· Movement of the head and opening of the mouth when the corner of the mouth or cheek is stroked (rooting). °· Increased sucking sounds, smacking lips, cooing, sighing, or squeaking. °· Hand-to-mouth movements. °· Increased sucking of fingers or hands. °Late Signs of Hunger °· Fussing. °· Intermittent crying. °Extreme Signs of Hunger °Signs of extreme hunger will require calming and consoling before your baby will be able to breastfeed successfully. Do not wait for the following signs of extreme hunger to occur before you initiate  breastfeeding: °· Restlessness. °· A loud, strong cry. °· Screaming. °BREASTFEEDING BASICS °Breastfeeding Initiation °· Find a comfortable place to sit or lie down, with your neck and back well supported. °· Place a pillow or rolled up blanket under your baby to bring him or her to the level of your breast (if you are seated). Nursing pillows are specially designed to help support your arms and your baby while you breastfeed. °· Make sure that your baby's abdomen is facing your abdomen. °· Gently massage your breast. With your fingertips, massage from your chest wall toward your nipple in a circular motion. This encourages milk flow. You may need to continue this action during the feeding if your milk flows slowly. °· Support your breast with 4 fingers underneath and your thumb above your nipple. Make sure your fingers are well away from your nipple and your baby's mouth. °· Stroke your baby's lips gently with your finger or nipple. °· When your baby's mouth is open wide enough, quickly bring your baby to your breast, placing your entire nipple and as much of the colored area around your nipple (areola) as possible into your baby's mouth. °¨ More areola should be visible above your baby's upper lip than below the lower lip. °¨ Your baby's tongue should be between his or her lower gum and your breast. °· Ensure that your baby's mouth is correctly positioned around your nipple (latched). Your baby's lips should create a seal on your breast and be turned out (everted). °· It is common for your baby to suck about 2-3 minutes in order to start the flow of breast milk. °Latching °Teaching your baby how to latch on to your breast properly is very important. An improper latch can cause nipple pain and decreased milk supply for you and poor weight gain in your baby. Also, if your baby is not latched onto your nipple properly, he or she may swallow some air during feeding. This can make your baby fussy. Burping your baby when  you switch breasts during the feeding can help to get rid of the air. However, teaching your baby to latch on properly is still the best way to prevent fussiness from swallowing air while breastfeeding. °Signs that your baby has successfully latched on to your nipple: °· Silent tugging or silent sucking, without causing you pain. °· Swallowing heard between every 3-4 sucks. °· Muscle movement above and in front of his or her ears while sucking. °Signs that your baby has not successfully latched on to nipple: °· Sucking sounds or smacking sounds from your baby while breastfeeding. °· Nipple pain. °If you think your baby has not latched on correctly, slip your finger into the corner of your baby's mouth to break the suction and place it between your baby's gums. Attempt breastfeeding initiation again. °Signs of Successful Breastfeeding °  Signs from your baby: °· A gradual decrease in the number of sucks or complete cessation of sucking. °· Falling asleep. °· Relaxation of his or her body. °· Retention of a small amount of milk in his or her mouth. °· Letting go of your breast by himself or herself. °Signs from you: °· Breasts that have increased in firmness, weight, and size 1-3 hours after feeding. °· Breasts that are softer immediately after breastfeeding. °· Increased milk volume, as well as a change in milk consistency and color by the fifth day of breastfeeding. °· Nipples that are not sore, cracked, or bleeding. °Signs That Your Baby is Getting Enough Milk °· Wetting at least 3 diapers in a 24-hour period. The urine should be clear and pale yellow by age 5 days. °· At least 3 stools in a 24-hour period by age 5 days. The stool should be soft and yellow. °· At least 3 stools in a 24-hour period by age 7 days. The stool should be seedy and yellow. °· No loss of weight greater than 10% of birth weight during the first 3 days of age. °· Average weight gain of 4-7 ounces (113-198 g) per week after age 4  days. °· Consistent daily weight gain by age 5 days, without weight loss after the age of 2 weeks. °After a feeding, your baby may spit up a small amount. This is common. °BREASTFEEDING FREQUENCY AND DURATION °Frequent feeding will help you make more milk and can prevent sore nipples and breast engorgement. Breastfeed when you feel the need to reduce the fullness of your breasts or when your baby shows signs of hunger. This is called "breastfeeding on demand." Avoid introducing a pacifier to your baby while you are working to establish breastfeeding (the first 4-6 weeks after your baby is born). After this time you may choose to use a pacifier. Research has shown that pacifier use during the first year of a baby's life decreases the risk of sudden infant death syndrome (SIDS). °Allow your baby to feed on each breast as long as he or she wants. Breastfeed until your baby is finished feeding. When your baby unlatches or falls asleep while feeding from the first breast, offer the second breast. Because newborns are often sleepy in the first few weeks of life, you may need to awaken your baby to get him or her to feed. °Breastfeeding times will vary from baby to baby. However, the following rules can serve as a guide to help you ensure that your baby is properly fed: °· Newborns (babies 4 weeks of age or younger) may breastfeed every 1-3 hours. °· Newborns should not go longer than 3 hours during the day or 5 hours during the night without breastfeeding. °· You should breastfeed your baby a minimum of 8 times in a 24-hour period until you begin to introduce solid foods to your baby at around 6 months of age. °BREAST MILK PUMPING °Pumping and storing breast milk allows you to ensure that your baby is exclusively fed your breast milk, even at times when you are unable to breastfeed. This is especially important if you are going back to work while you are still breastfeeding or when you are not able to be present during  feedings. Your lactation consultant can give you guidelines on how long it is safe to store breast milk. °A breast pump is a machine that allows you to pump milk from your breast into a sterile bottle. The pumped breast milk can   then be stored in a refrigerator or freezer. Some breast pumps are operated by hand, while others use electricity. Ask your lactation consultant which type will work best for you. Breast pumps can be purchased, but some hospitals and breastfeeding support groups lease breast pumps on a monthly basis. A lactation consultant can teach you how to hand express breast milk, if you prefer not to use a pump. °CARING FOR YOUR BREASTS WHILE YOU BREASTFEED °Nipples can become dry, cracked, and sore while breastfeeding. The following recommendations can help keep your breasts moisturized and healthy: °· Avoid using soap on your nipples. °· Wear a supportive bra. Although not required, special nursing bras and tank tops are designed to allow access to your breasts for breastfeeding without taking off your entire bra or top. Avoid wearing underwire-style bras or extremely tight bras. °· Air dry your nipples for 3-4 minutes after each feeding. °· Use only cotton bra pads to absorb leaked breast milk. Leaking of breast milk between feedings is normal. °· Use lanolin on your nipples after breastfeeding. Lanolin helps to maintain your skin's normal moisture barrier. If you use pure lanolin, you do not need to wash it off before feeding your baby again. Pure lanolin is not toxic to your baby. You may also hand express a few drops of breast milk and gently massage that milk into your nipples and allow the milk to air dry. °In the first few weeks after giving birth, some women experience extremely full breasts (engorgement). Engorgement can make your breasts feel heavy, warm, and tender to the touch. Engorgement peaks within 3-5 days after you give birth. The following recommendations can help ease  engorgement: °· Completely empty your breasts while breastfeeding or pumping. You may want to start by applying warm, moist heat (in the shower or with warm water-soaked hand towels) just before feeding or pumping. This increases circulation and helps the milk flow. If your baby does not completely empty your breasts while breastfeeding, pump any extra milk after he or she is finished. °· Wear a snug bra (nursing or regular) or tank top for 1-2 days to signal your body to slightly decrease milk production. °· Apply ice packs to your breasts, unless this is too uncomfortable for you. °· Make sure that your baby is latched on and positioned properly while breastfeeding. °If engorgement persists after 48 hours of following these recommendations, contact your health care provider or a lactation consultant. °OVERALL HEALTH CARE RECOMMENDATIONS WHILE BREASTFEEDING °· Eat healthy foods. Alternate between meals and snacks, eating 3 of each per day. Because what you eat affects your breast milk, some of the foods may make your baby more irritable than usual. Avoid eating these foods if you are sure that they are negatively affecting your baby. °· Drink milk, fruit juice, and water to satisfy your thirst (about 10 glasses a day). °· Rest often, relax, and continue to take your prenatal vitamins to prevent fatigue, stress, and anemia. °· Continue breast self-awareness checks. °· Avoid chewing and smoking tobacco. Chemicals from cigarettes that pass into breast milk and exposure to secondhand smoke may harm your baby. °· Avoid alcohol and drug use, including marijuana. °Some medicines that may be harmful to your baby can pass through breast milk. It is important to ask your health care provider before taking any medicine, including all over-the-counter and prescription medicine as well as vitamin and herbal supplements. °It is possible to become pregnant while breastfeeding. If birth control is desired, ask your health care    provider about options that will be safe for your baby. °SEEK MEDICAL CARE IF: °· You feel like you want to stop breastfeeding or have become frustrated with breastfeeding. °· You have painful breasts or nipples. °· Your nipples are cracked or bleeding. °· Your breasts are red, tender, or warm. °· You have a swollen area on either breast. °· You have a fever or chills. °· You have nausea or vomiting. °· You have drainage other than breast milk from your nipples. °· Your breasts do not become full before feedings by the fifth day after you give birth. °· You feel sad and depressed. °· Your baby is too sleepy to eat well. °· Your baby is having trouble sleeping.   °· Your baby is wetting less than 3 diapers in a 24-hour period. °· Your baby has less than 3 stools in a 24-hour period. °· Your baby's skin or the white part of his or her eyes becomes yellow.   °· Your baby is not gaining weight by 5 days of age. °SEEK IMMEDIATE MEDICAL CARE IF: °· Your baby is overly tired (lethargic) and does not want to wake up and feed. °· Your baby develops an unexplained fever. °  °This information is not intended to replace advice given to you by your health care provider. Make sure you discuss any questions you have with your health care provider. °  °Document Released: 11/04/2005 Document Revised: 07/26/2015 Document Reviewed: 04/28/2013 °Elsevier Interactive Patient Education ©2016 [employer] ° °

## 2016-01-17 NOTE — Lactation Note (Signed)
This note was copied from a baby's chart. Lactation Consultation Note  Patient Name: Morgan Perry [id number] Date: 01/17/2016 Reason for consult: Follow-up assessment;NICU baby NICU baby 1 hours old. Mom reports that she is about to go up to offer breast to baby in NICU. Mom states that baby doing well at breast and will probably be d/c on Monday--01/22/16. Mom reports that she has a personal DEBP at home and an appointment with [facility] ([facility]). Enc mom to use DEBP in the pumping rooms in NICU d/t it being a hospital-grade pump. Enc mom to keep pumping every 2-3 hours/8 times per 24 hours followed by hand expression. Mom aware of OP/BFSG and LC phone line assistance after D/C.   Maternal Data    Feeding Feeding Type: Breast Fed  LATCH Score/Interventions                      Lactation Tools Discussed/Used     Consult Status Consult Status: PRN    Geralynn Ochs 01/17/2016, 10:49 AM

## 2016-01-17 NOTE — Progress Notes (Signed)
PPD #2 SVD  S:  Reports feeling ok - very tired             Tolerating po/ No nausea or vomiting             Bleeding is light             Pain controlled with motrin             Up ad lib / ambulatory / voiding QS  Newborn stable in NICU / breast feeding with no difficulties since 0400 this AM  O:               VS: BP 125/59 mmHg  Pulse 78  Temp(Src) 98.3 F (36.8 C) (Oral)  Resp 22  Ht  (1.702 m)  Wt 73.029 kg (161 lb)  BMI 25.21 kg/m2  SpO2 100%  LMP 04/04/2015  Breast feeding   LABS:               Recent Labs  01/15/16 0933 01/16/16 0636  WBC 23.8* 18.7*  HGB 9.4* 7.4*  PLT 254 231               Blood type: O/Positive/-- (07/20 0000)  Rubella: Immune (07/20 0000)                                  Physical Exam:             Alert and oriented x 3  Abdomen: soft, non-tender, non-distended              Fundus: firm, non-tender, U-2  Perineum: ice pack in place  Lochia: light  Extremities: no edema, no calf pain or tenderness    A: PPD # 2             IDA of pregnancy with ABL compounding             Intrapartum presumed chorio - afebrile  ABL Anemia    Doing well - stable status  P: Routine post partum orders   Iron and magnesium x 8 weeks with PNV             D/C home today  Raelyn Mora, M MSN, CNM  01/17/2016, 8:19 AM

## 2016-01-17 NOTE — Discharge Summary (Signed)
OB Discharge Summary     Patient Name: Morgan Perry DOB: [birth year] MRN: [id number]  Date of admission: 01/14/2016 Delivering MD: COUSINS, SHERONETTE   Date of discharge: 01/17/2016  Admitting diagnosis: 40wks, Induction for postdates Intrauterine pregnancy: [redacted]w[redacted]d     Secondary diagnosis:  Principal Problem:   Postpartum care following vaginal delivery (2/27) Active Problems:   Chorioamnionitis   Acute blood loss anemia  Additional problems: none     Discharge diagnosis: Postterm Pregnancy delivered                                                                                                Post partum procedures: IV antibiotics x 24 hrs  Augmentation: AROM and Pitocin  Complications: Intrauterine Inflammation or infection (Chorioamniotis)  Hospital course:  Induction of Labor With Vaginal Delivery   25 y.o. yo G1P1001 at [redacted]w[redacted]d was admitted to the hospital 01/14/2016 for induction of labor.  Indication for induction: Postdates.  Patient had a complicated labor course as follows: Epidural for pain management. Membrane Rupture Time/Date: 4:53 PM ,01/14/2016   Presumed chorioamnionitis - pharmacy consult with IV Vancomycin and Gentamycin Intrapartum Procedures: Episiotomy: None [1]                                         Lacerations:  2nd degree [3];Perineal [11]  Patient had delivery of a Viable infant.  Information for the patient's newborn:  Gladine, Plude [[id number]]  Delivery Method: Vaginal, Spontaneous Delivery (Filed from Delivery Summary)   01/15/2016  Details of delivery can be found in separate delivery note.  Patient had a routine postpartum course. Patient is discharged home 01/17/2016.   Physical exam  Filed Vitals:   01/15/16 2300 01/16/16 0715 01/16/16 1815 01/17/16 0641  BP: 110/62 101/50 117/58 125/59  Pulse: 77 78  78  Temp: 97.7 F (36.5 C) 97.7 F (36.5 C)  98.3 F (36.8 C)  TempSrc: Oral Oral  Oral  Resp: Height:       Weight:      SpO2:       General: alert, cooperative and no distress Lochia: appropriate Uterine Fundus: firm, midline, U-2 Perineum: Healing well with no significant drainage, No significant erythema DVT Evaluation: No evidence of DVT seen on physical exam. Negative Homan's sign. No cords or calf tenderness. No significant calf/ankle edema. Labs: Lab Results  Component Value Date   WBC 18.7* 01/16/2016   HGB 7.4* 01/16/2016   HCT 21.5* 01/16/2016   MCV 84.6 01/16/2016   PLT 231 01/16/2016   CMP Latest Ref Rng 10/19/2013  Glucose 70 - 99 mg/dL 88  BUN 6 - 23 mg/dL 12  Creatinine 7.82 - 9.56 mg/dL 2.13  Sodium 086 - 578 mEq/L 141  Potassium 3.5 - 5.1 mEq/L 3.9  Chloride 96 - 112 mEq/L 105  CO2 19 - 32 mEq/L 24  Calcium 8.4 - 10.5 mg/dL 8.9  Total Protein 6.0 - 8.3 g/dL 7.4  Total Bilirubin 0.3 - 1.2 mg/dL  1.3(H)  Alkaline Phos 39 - 117 U/L 41  AST 0 - 37 U/L 19  ALT 0 - 35 U/L 9    Discharge instruction: per After Visit Summary and "Baby and Me Booklet".  After visit meds:    Medication List    STOP taking these medications        ondansetron 4 MG disintegrating tablet  Commonly known as:  ZOFRAN ODT      TAKE these medications        ferrous sulfate 325 (65 FE) MG tablet  Take 1 tablet (325 mg total) by mouth 2 (two) times daily with a meal.     ibuprofen 600 MG tablet  Commonly known as:  ADVIL,MOTRIN  Take 1 tablet (600 mg total) by mouth every 6 (six) hours.     Magnesium Oxide 200 MG Tabs  Take 1 tablet (200 mg total) by mouth daily.     prenatal multivitamin Tabs tablet  Take 1 tablet by mouth daily at 12 noon.        Diet: routine diet  Activity: Advance as tolerated. Pelvic rest for 6 weeks.   Outpatient follow up:6 weeks Follow up Appt: 02/26/2016 at 9:45 AM and 10:00 AM Follow up Visit:No Follow-up on file.  Postpartum contraception: Undecided  Newborn Data: Live born female on 01/15/2016 Birth Weight: 8 lb 1.5 oz (3670 g) APGAR:  6, 9  Baby Feeding: Bottle and Breast Disposition:NICU   01/17/2016 Raelyn Mora, Judie Petit, CNM

## 2017-11-18 NOTE — L&D Delivery Note (Signed)
Delivery Note At 2:39 AM a viable and healthy female was delivered via Vaginal, Spontaneous (Presentation:ROA ;vtx  ).  APGAR: 8,9 ; weight pending .   Placenta status:spontaneous, intact , not .  Cord:none  with the following complications: .  Cord pH: n/a  Anesthesia:epidural   Episiotomy: None Lacerations:  Right vaginalSulcus Suture Repair: 3.0 chromic Est. Blood Loss (mL): 100  Mom to postpartum.  Baby to Couplet care / Skin to Skin.  Fernando Stoiber A Ozzie Remmers 06/14/2018, 3:03 AM

## 2017-11-25 LAB — OB RESULTS CONSOLE RPR: RPR: NONREACTIVE

## 2017-11-25 LAB — OB RESULTS CONSOLE ANTIBODY SCREEN: Antibody Screen: NEGATIVE

## 2017-11-25 LAB — OB RESULTS CONSOLE GC/CHLAMYDIA
Chlamydia: NEGATIVE
Gonorrhea: NEGATIVE

## 2017-11-25 LAB — OB RESULTS CONSOLE ABO/RH: RH Type: POSITIVE

## 2017-11-25 LAB — OB RESULTS CONSOLE HEPATITIS B SURFACE ANTIGEN: Hepatitis B Surface Ag: NEGATIVE

## 2017-11-25 LAB — OB RESULTS CONSOLE HIV ANTIBODY (ROUTINE TESTING): HIV: NONREACTIVE

## 2017-11-25 LAB — OB RESULTS CONSOLE RUBELLA ANTIBODY, IGM: Rubella: IMMUNE

## 2018-02-24 DIAGNOSIS — O358XX0 Maternal care for other (suspected) fetal abnormality and damage, not applicable or unspecified: Secondary | ICD-10-CM | POA: Diagnosis not present

## 2018-02-24 DIAGNOSIS — Z3A23 23 weeks gestation of pregnancy: Secondary | ICD-10-CM | POA: Diagnosis not present

## 2018-03-23 DIAGNOSIS — Z3689 Encounter for other specified antenatal screening: Secondary | ICD-10-CM | POA: Diagnosis not present

## 2018-03-23 DIAGNOSIS — Z3A27 27 weeks gestation of pregnancy: Secondary | ICD-10-CM | POA: Diagnosis not present

## 2018-03-23 DIAGNOSIS — O358XX0 Maternal care for other (suspected) fetal abnormality and damage, not applicable or unspecified: Secondary | ICD-10-CM | POA: Diagnosis not present

## 2018-04-10 DIAGNOSIS — O3663X0 Maternal care for excessive fetal growth, third trimester, not applicable or unspecified: Secondary | ICD-10-CM | POA: Diagnosis not present

## 2018-04-10 DIAGNOSIS — Z3A29 29 weeks gestation of pregnancy: Secondary | ICD-10-CM | POA: Diagnosis not present

## 2018-04-10 DIAGNOSIS — Z3689 Encounter for other specified antenatal screening: Secondary | ICD-10-CM | POA: Diagnosis not present

## 2018-04-20 DIAGNOSIS — Z3A31 31 weeks gestation of pregnancy: Secondary | ICD-10-CM | POA: Diagnosis not present

## 2018-04-20 DIAGNOSIS — O3663X0 Maternal care for excessive fetal growth, third trimester, not applicable or unspecified: Secondary | ICD-10-CM | POA: Diagnosis not present

## 2018-05-05 DIAGNOSIS — Z23 Encounter for immunization: Secondary | ICD-10-CM | POA: Diagnosis not present

## 2018-05-05 DIAGNOSIS — Z3A33 33 weeks gestation of pregnancy: Secondary | ICD-10-CM | POA: Diagnosis not present

## 2018-05-05 DIAGNOSIS — O3663X0 Maternal care for excessive fetal growth, third trimester, not applicable or unspecified: Secondary | ICD-10-CM | POA: Diagnosis not present

## 2018-05-18 DIAGNOSIS — Z3685 Encounter for antenatal screening for Streptococcus B: Secondary | ICD-10-CM | POA: Diagnosis not present

## 2018-05-18 DIAGNOSIS — O3663X0 Maternal care for excessive fetal growth, third trimester, not applicable or unspecified: Secondary | ICD-10-CM | POA: Diagnosis not present

## 2018-05-18 DIAGNOSIS — Z3A35 35 weeks gestation of pregnancy: Secondary | ICD-10-CM | POA: Diagnosis not present

## 2018-05-26 DIAGNOSIS — Z3A36 36 weeks gestation of pregnancy: Secondary | ICD-10-CM | POA: Diagnosis not present

## 2018-05-26 DIAGNOSIS — O3663X0 Maternal care for excessive fetal growth, third trimester, not applicable or unspecified: Secondary | ICD-10-CM | POA: Diagnosis not present

## 2018-05-26 LAB — OB RESULTS CONSOLE GBS: GBS: NEGATIVE

## 2018-06-02 DIAGNOSIS — Z3A37 37 weeks gestation of pregnancy: Secondary | ICD-10-CM | POA: Diagnosis not present

## 2018-06-02 DIAGNOSIS — O3663X0 Maternal care for excessive fetal growth, third trimester, not applicable or unspecified: Secondary | ICD-10-CM | POA: Diagnosis not present

## 2018-06-09 ENCOUNTER — Telehealth (HOSPITAL_COMMUNITY): Payer: Self-pay | Admitting: *Deleted

## 2018-06-09 ENCOUNTER — Other Ambulatory Visit: Payer: Self-pay | Admitting: Obstetrics and Gynecology

## 2018-06-09 ENCOUNTER — Encounter (HOSPITAL_COMMUNITY): Payer: Self-pay | Admitting: *Deleted

## 2018-06-09 DIAGNOSIS — Z3A38 38 weeks gestation of pregnancy: Secondary | ICD-10-CM | POA: Diagnosis not present

## 2018-06-09 DIAGNOSIS — O3663X0 Maternal care for excessive fetal growth, third trimester, not applicable or unspecified: Secondary | ICD-10-CM | POA: Diagnosis not present

## 2018-06-09 NOTE — Telephone Encounter (Signed)
Preadmission screen  

## 2018-06-13 ENCOUNTER — Encounter (HOSPITAL_COMMUNITY): Payer: Self-pay

## 2018-06-13 ENCOUNTER — Other Ambulatory Visit: Payer: Self-pay

## 2018-06-13 ENCOUNTER — Inpatient Hospital Stay (HOSPITAL_COMMUNITY): Payer: BLUE CROSS/BLUE SHIELD | Admitting: Anesthesiology

## 2018-06-13 ENCOUNTER — Inpatient Hospital Stay (HOSPITAL_COMMUNITY)
Admission: RE | Admit: 2018-06-13 | Discharge: 2018-06-15 | DRG: 806 | Disposition: A | Discharge status: Home / Self Care | Payer: BLUE CROSS/BLUE SHIELD | Attending: Obstetrics and Gynecology | Admitting: Obstetrics and Gynecology

## 2018-06-13 DIAGNOSIS — O26893 Other specified pregnancy related conditions, third trimester: Secondary | ICD-10-CM | POA: Diagnosis not present

## 2018-06-13 DIAGNOSIS — O329XX0 Maternal care for malpresentation of fetus, unspecified, not applicable or unspecified: Secondary | ICD-10-CM | POA: Diagnosis present

## 2018-06-13 DIAGNOSIS — O2243 Hemorrhoids in pregnancy, third trimester: Secondary | ICD-10-CM | POA: Diagnosis present

## 2018-06-13 DIAGNOSIS — Z3A39 39 weeks gestation of pregnancy: Secondary | ICD-10-CM

## 2018-06-13 DIAGNOSIS — Z3490 Encounter for supervision of normal pregnancy, unspecified, unspecified trimester: Secondary | ICD-10-CM | POA: Diagnosis present

## 2018-06-13 LAB — CBC
HCT: 37.7 % (ref 36.0–46.0)
Hemoglobin: 13 g/dL (ref 12.0–15.0)
MCH: 31 pg (ref 26.0–34.0)
MCHC: 34.5 g/dL (ref 30.0–36.0)
MCV: 90 fL (ref 78.0–100.0)
Platelets: 223 10*3/uL (ref 150–400)
RBC: 4.19 MIL/uL (ref 3.87–5.11)
RDW: 13.4 % (ref 11.5–15.5)
WBC: 5 10*3/uL (ref 4.0–10.5)

## 2018-06-13 LAB — TYPE AND SCREEN
ABO/RH(D): O POS
Antibody Screen: NEGATIVE

## 2018-06-13 LAB — ABO/RH: ABO/RH(D): O POS

## 2018-06-13 MED ORDER — PHENYLEPHRINE 40 MCG/ML (10ML) SYRINGE FOR IV PUSH (FOR BLOOD PRESSURE SUPPORT): 80.0000 ug | PREFILLED_SYRINGE | INTRAVENOUS | Status: DC | PRN

## 2018-06-13 MED ORDER — OXYTOCIN BOLUS FROM INFUSION
500.0000 mL | Freq: Once | INTRAVENOUS | Status: AC
  Administered 2018-06-14: 500 mL via INTRAVENOUS

## 2018-06-13 MED ORDER — SOD CITRATE-CITRIC ACID 500-334 MG/5ML PO SOLN
30.0000 mL | ORAL | Status: DC | PRN
Start: 2018-06-13 — End: 2018-06-14

## 2018-06-13 MED ORDER — PHENYLEPHRINE 40 MCG/ML (10ML) SYRINGE FOR IV PUSH (FOR BLOOD PRESSURE SUPPORT)
80.0000 ug | PREFILLED_SYRINGE | INTRAVENOUS | Status: DC | PRN
  Filled 2018-06-13: qty 5

## 2018-06-13 MED ORDER — DIPHENHYDRAMINE HCL 50 MG/ML IJ SOLN: 12.5000 mg | INTRAMUSCULAR | Status: DC | PRN

## 2018-06-13 MED ORDER — ONDANSETRON HCL 4 MG/2ML IJ SOLN
4.0000 mg | Freq: Four times a day (QID) | INTRAMUSCULAR | Status: DC | PRN
  Administered 2018-06-13: 4 mg via INTRAVENOUS
  Filled 2018-06-13: qty 2

## 2018-06-13 MED ORDER — ACETAMINOPHEN 325 MG PO TABS: 650.0000 mg | ORAL_TABLET | ORAL | Status: DC | PRN

## 2018-06-13 MED ORDER — OXYTOCIN 40 UNITS IN LACTATED RINGERS INFUSION - SIMPLE MED: 2.5000 [IU]/h | INTRAVENOUS | Status: DC

## 2018-06-13 MED ORDER — EPHEDRINE 5 MG/ML INJ: 10.0000 mg | INTRAVENOUS | Status: DC | PRN

## 2018-06-13 MED ORDER — EPHEDRINE 5 MG/ML INJ
10.0000 mg | INTRAVENOUS | Status: DC | PRN
  Filled 2018-06-13: qty 2

## 2018-06-13 MED ORDER — LACTATED RINGERS IV SOLN: 500.0000 mL | Freq: Once | INTRAVENOUS | Status: DC

## 2018-06-13 MED ORDER — FENTANYL 2.5 MCG/ML BUPIVACAINE 1/10 % EPIDURAL INFUSION (WH - ANES): 14.0000 mL/h | INTRAMUSCULAR | Status: DC | PRN

## 2018-06-13 MED ORDER — FENTANYL 2.5 MCG/ML BUPIVACAINE 1/10 % EPIDURAL INFUSION (WH - ANES)
14.0000 mL/h | INTRAMUSCULAR | Status: DC | PRN
  Administered 2018-06-13 (×3): 14 mL/h via EPIDURAL
  Filled 2018-06-13 (×2): qty 100

## 2018-06-13 MED ORDER — LACTATED RINGERS IV SOLN
500.0000 mL | Freq: Once | INTRAVENOUS | Status: AC
  Administered 2018-06-13: 500 mL via INTRAVENOUS

## 2018-06-13 MED ORDER — LIDOCAINE HCL (PF) 1 % IJ SOLN
INTRAMUSCULAR | Status: DC | PRN
  Administered 2018-06-13: 5 mL via EPIDURAL
  Administered 2018-06-13: 2 mL via EPIDURAL
  Administered 2018-06-13: 3 mL via EPIDURAL

## 2018-06-13 MED ORDER — LIDOCAINE HCL (PF) 1 % IJ SOLN
30.0000 mL | INTRAMUSCULAR | Status: DC | PRN
  Filled 2018-06-13: qty 30

## 2018-06-13 MED ORDER — MISOPROSTOL 25 MCG QUARTER TABLET
25.0000 ug | ORAL_TABLET | ORAL | Status: DC | PRN
  Administered 2018-06-13: 25 ug via VAGINAL
  Filled 2018-06-13 (×2): qty 1

## 2018-06-13 MED ORDER — PHENYLEPHRINE 40 MCG/ML (10ML) SYRINGE FOR IV PUSH (FOR BLOOD PRESSURE SUPPORT)
80.0000 ug | PREFILLED_SYRINGE | INTRAVENOUS | Status: DC | PRN
  Filled 2018-06-13: qty 10
  Filled 2018-06-13: qty 5

## 2018-06-13 MED ORDER — LACTATED RINGERS IV SOLN
500.0000 mL | INTRAVENOUS | Status: DC | PRN
  Administered 2018-06-13: 500 mL via INTRAVENOUS

## 2018-06-13 MED ORDER — LACTATED RINGERS IV SOLN
INTRAVENOUS | Status: DC
  Administered 2018-06-13 (×4): via INTRAVENOUS

## 2018-06-13 MED ORDER — OXYTOCIN 40 UNITS IN LACTATED RINGERS INFUSION - SIMPLE MED
1.0000 m[IU]/min | INTRAVENOUS | Status: DC
  Administered 2018-06-13: 14 m[IU]/min via INTRAVENOUS
  Administered 2018-06-13: 6 m[IU]/min via INTRAVENOUS
  Administered 2018-06-13: 4 m[IU]/min via INTRAVENOUS
  Administered 2018-06-13: 2 m[IU]/min via INTRAVENOUS
  Filled 2018-06-13: qty 1000

## 2018-06-13 MED ORDER — TERBUTALINE SULFATE 1 MG/ML IJ SOLN
0.2500 mg | Freq: Once | INTRAMUSCULAR | Status: DC | PRN
  Filled 2018-06-13: qty 1

## 2018-06-13 NOTE — H&P (Signed)
Morgan Perry is Perry 27 y.o. female presenting for IOL @ term OB History    Gravida  2   Para  1   Term  1   Preterm  0   AB  0   Living  1     SAB  0   TAB  0   Ectopic  0   Multiple  0   Live Births  1          Past Medical History:  Diagnosis Date  . Hx of chlamydia infection   . Low lying placenta nos or without hemorrhage, third trimester   . Medical history non-contributory   . Postpartum care following vaginal delivery (2/27) 01/15/2016  . Vaginal Pap smear, abnormal    Past Surgical History:  Procedure Laterality Date  . BUNIONECTOMY     R foot   Family History: family history includes Cancer in her paternal uncle; Down syndrome in her cousin; Hypertension in her maternal grandmother. Social History:  reports that she has never smoked. She has never used smokeless tobacco. She reports that she drank about 0.6 oz of alcohol per week. She reports that she does not use drugs.     Maternal Diabetes: No Genetic Screening: Normal Maternal Ultrasounds/Referrals: Normal Fetal Ultrasounds or other Referrals:  None Maternal Substance Abuse:  No Significant Maternal Medications:  None Significant Maternal Lab Results:  Lab values include: Group B Strep negative Other Comments:  None  Review of Systems  All other systems reviewed and are negative.  History Dilation: 1 Effacement (%): 40 Station: -2 Exam by:: Morgan OuchStephanie Russell, RN  Blood pressure 122/71, pulse 85, temperature 98.9 F (37.2 C), temperature source Oral, resp. rate 16, height 5\' 8"  (1.727 m), weight 75.9 kg (167 lb 7 oz), unknown if currently breastfeeding. Exam Physical Exam  Constitutional: She is oriented to person, place, and time. She appears well-developed and well-nourished.  HENT:  Head: Atraumatic.  Eyes: EOM are normal.  Neck: Neck supple.  Cardiovascular: Regular rhythm.  Respiratory: Effort normal.  GI: Soft.  Musculoskeletal: She exhibits no edema.  Neurological: She is  alert and oriented to person, place, and time.  Skin: Skin is warm and dry.  Psychiatric: She has Perry normal mood and affect.    Prenatal labs: ABO, Rh: --/--/O POS (07/27 [id number]) Antibody: PENDING (07/27 [id number]) Rubella: Immune (01/08 0000) RPR: Nonreactive (01/08 0000)  HBsAg: Negative (01/08 0000)  HIV: Non-reactive (01/08 0000)  GBS: Negative (07/09 0000)   Assessment/Plan: Term gestation P) admit routine. Cytotec, intracervical balloon. Analgesic prn   Morgan Perry Morgan Perry 06/13/2018, 9:44 AM

## 2018-06-13 NOTE — Anesthesia Preprocedure Evaluation (Signed)
Anesthesia Evaluation  Patient identified by MRN, date of birth, ID band Patient awake    Reviewed: Allergy & Precautions, NPO status , Patient's Chart, lab work & pertinent test results  Airway Mallampati: II  TM Distance: >3 FB Neck ROM: Full    Dental  (+) Teeth Intact, Dental Advisory Given   Pulmonary neg pulmonary ROS,    Pulmonary exam normal breath sounds clear to auscultation       Cardiovascular negative cardio ROS Normal cardiovascular exam Rhythm:Regular Rate:Normal     Neuro/Psych negative neurological ROS     GI/Hepatic negative GI ROS, Neg liver ROS,   Endo/Other  negative endocrine ROS  Renal/GU negative Renal ROS     Musculoskeletal negative musculoskeletal ROS (+)   Abdominal   Peds  Hematology negative hematology ROS (+) Plt 223k   Anesthesia Other Findings Day of surgery medications reviewed with the patient.  Reproductive/Obstetrics (+) Pregnancy                             Anesthesia Physical Anesthesia Plan  ASA: II  Anesthesia Plan: Epidural   Post-op Pain Management:    Induction:   PONV Risk Score and Plan: 2 and Treatment may vary due to age or medical condition  Airway Management Planned: Natural Airway  Additional Equipment:   Intra-op Plan:   Post-operative Plan:   Informed Consent: I have reviewed the patients History and Physical, chart, labs and discussed the procedure including the risks, benefits and alternatives for the proposed anesthesia with the patient or authorized representative who has indicated his/her understanding and acceptance.   Dental advisory given  Plan Discussed with:   Anesthesia Plan Comments: (Patient identified. Risks/Benefits/Options discussed with patient including but not limited to bleeding, infection, nerve damage, paralysis, failed block, incomplete pain control, headache, blood pressure changes, nausea,  vomiting, reactions to medication both or allergic, itching and postpartum back pain. Confirmed with bedside nurse the patient's most recent platelet count. Confirmed with patient that they are not currently taking any anticoagulation, have any bleeding history or any family history of bleeding disorders. Patient expressed understanding and wished to proceed. All questions were answered. )        Anesthesia Quick Evaluation

## 2018-06-13 NOTE — Progress Notes (Signed)
S; no complaint  O: BP 126/73   Pulse 84   Temp 98.7 F (37.1 C) (Oral)   Resp 16   Ht 5\' 8"  (1.727 m)   Wt 75.9 kg (167 lb 7 oz)   SpO2 98%   BMI 25.46 kg/m  VE 5/70/-3 applied Asynclitic. LOT AROM mec fluid IUPC placed  Tracing: baseline 135 (+) accel Ctx q 1-4 mins  IMP: arrest of dilation due to suboptimal ctx and malpresentation P) right exaggerated sims.  Cont pitocin

## 2018-06-13 NOTE — Progress Notes (Signed)
S: comfortable  O: Epidural Pitocin VE 5-6/60/-1 Per RN  Tracing: baseline 135 (+) accel Ctx q 2-5  IMP: latent/active phase P) cont pitocin. Exaggerated sims

## 2018-06-13 NOTE — Anesthesia Pain Management Evaluation Note (Signed)
  CRNA Pain Management Visit Note  Patient: Morgan Perry, 27 y.o., female  "Hello [facility] am a [facility]. We have an anesthesia team available at all times to provide care throughout the hospital, including epidural management and anesthesia for C-section. I don't know your plan for the delivery whether it a natural birth, water birth, IV sedation, nitrous supplementation, doula or epidural, but we want to meet your pain goals."   1.Was your pain managed to your expectations on prior hospitalizations?   Yes   2.What is your expectation for pain management during this hospitalization?     Epidural  3.How can we help you reach that goal? Epidural in place and working well.  Record the patient's initial score and the patient's pain goal.   Pain: 0  Pain Goal: 4 [facility] wants you to be able to say your pain was always managed very well.  Morgan Perry 06/13/2018

## 2018-06-14 ENCOUNTER — Encounter (HOSPITAL_COMMUNITY): Payer: Self-pay

## 2018-06-14 LAB — RPR: RPR Ser Ql: NONREACTIVE

## 2018-06-14 MED ORDER — COCONUT OIL OIL: 1.0000 "application " | TOPICAL_OIL | Status: DC | PRN

## 2018-06-14 MED ORDER — PRENATAL MULTIVITAMIN CH
1.0000 | ORAL_TABLET | Freq: Every day | ORAL | Status: DC
  Administered 2018-06-14 – 2018-06-15 (×2): 1 via ORAL
  Filled 2018-06-14 (×2): qty 1

## 2018-06-14 MED ORDER — OXYCODONE HCL 5 MG PO TABS: 5.0000 mg | ORAL_TABLET | ORAL | Status: DC | PRN

## 2018-06-14 MED ORDER — SIMETHICONE 80 MG PO CHEW
80.0000 mg | CHEWABLE_TABLET | ORAL | Status: DC | PRN
  Administered 2018-06-14: 80 mg via ORAL
  Filled 2018-06-14: qty 1

## 2018-06-14 MED ORDER — DIBUCAINE 1 % RE OINT: 1.0000 "application " | TOPICAL_OINTMENT | RECTAL | Status: DC | PRN

## 2018-06-14 MED ORDER — WITCH HAZEL-GLYCERIN EX PADS: 1.0000 "application " | MEDICATED_PAD | CUTANEOUS | Status: DC | PRN

## 2018-06-14 MED ORDER — ONDANSETRON HCL 4 MG PO TABS: 4.0000 mg | ORAL_TABLET | ORAL | Status: DC | PRN

## 2018-06-14 MED ORDER — OXYCODONE HCL 5 MG PO TABS: 10.0000 mg | ORAL_TABLET | ORAL | Status: DC | PRN

## 2018-06-14 MED ORDER — ONDANSETRON HCL 4 MG/2ML IJ SOLN: 4.0000 mg | INTRAMUSCULAR | Status: DC | PRN

## 2018-06-14 MED ORDER — DIPHENHYDRAMINE HCL 25 MG PO CAPS
25.0000 mg | ORAL_CAPSULE | Freq: Four times a day (QID) | ORAL | Status: DC | PRN
Start: 2018-06-14 — End: 2018-06-15

## 2018-06-14 MED ORDER — SENNOSIDES-DOCUSATE SODIUM 8.6-50 MG PO TABS
2.0000 | ORAL_TABLET | ORAL | Status: DC
  Administered 2018-06-14: 2 via ORAL
  Filled 2018-06-14: qty 2

## 2018-06-14 MED ORDER — IBUPROFEN 600 MG PO TABS
600.0000 mg | ORAL_TABLET | Freq: Four times a day (QID) | ORAL | Status: DC
  Administered 2018-06-14 – 2018-06-15 (×6): 600 mg via ORAL
  Filled 2018-06-14 (×6): qty 1

## 2018-06-14 MED ORDER — ZOLPIDEM TARTRATE 5 MG PO TABS: 5.0000 mg | ORAL_TABLET | Freq: Every evening | ORAL | Status: DC | PRN

## 2018-06-14 MED ORDER — BENZOCAINE-MENTHOL 20-0.5 % EX AERO
1.0000 "application " | INHALATION_SPRAY | CUTANEOUS | Status: DC | PRN
  Filled 2018-06-14: qty 56

## 2018-06-14 MED ORDER — FERROUS SULFATE 325 (65 FE) MG PO TABS
325.0000 mg | ORAL_TABLET | Freq: Two times a day (BID) | ORAL | Status: DC
  Administered 2018-06-14 – 2018-06-15 (×3): 325 mg via ORAL
  Filled 2018-06-14 (×3): qty 1

## 2018-06-14 MED ORDER — ACETAMINOPHEN 325 MG PO TABS: 650.0000 mg | ORAL_TABLET | ORAL | Status: DC | PRN

## 2018-06-14 NOTE — Progress Notes (Signed)
PPD #0 SVD w/ right vaginal sulcus laceration  S:  Reports feeling tired             Tolerating po/ No nausea or vomiting             Bleeding is moderate w/ a few small clots             Pain controlled with PO meds             Up ad lib / ambulatory / unable to void this AM  Newborn female Breastfeeding well   O:       Vitals:   06/14/18 0416 06/14/18 0435 06/14/18 0530 06/14/18 0943  BP: 109/88 (!) 141/79 134/70 129/74  Pulse: 74 80 81 73  Resp: 16 20 18 18   Temp:  98.5 F (36.9 C) 98.3 F (36.8 C) 98.3 F (36.8 C)  TempSrc:  Oral Oral Oral  SpO2:      Weight:      Height:                LABS:              Recent Labs    06/13/18 0834  WBC 5.0  HGB 13.0  PLT 223   Blood type: --/--/O POS, O POS Rubella: Immune (01/08 0000)                    I&O: Intake/Output      07/27 0701 - 07/28 0700 07/28 0701 - 07/29 0700   I.V. (mL/kg) 688.1 (9.1)    Total Intake(mL/kg) 688.1 (9.1)    Urine (mL/kg/hr) 1400    Blood 100    Total Output 1500    Net -811.9                       Physical Exam:             General: Alert and oriented x3  Lungs: Clear and unlabored  Heart: regular rate and rhythm / no murmurs  Abdomen: soft, non-tender, non-distended  GU: Bladder distention-resolved after voiding              Fundus: firm, non-tender, U+2  Perineum: intact, no edema, 2 external hemorrhoids ~ 2 cm  Lochia: appropriate  Extremities: No edema, no calf pain, cords, or tenderness    A/P:  27 y.o. G2 P2002 Postpartum care after SVD -Normal exam -Routine PP orders -Void Q 2-3 hours to avoid bladder distension  Right vaginal sulcus laceration -Perineal hygiene -Nothing in vagina  Breastfeeding -Experienced -Advised skin-to-skin and feeding Q 2-3 hours and on demand -Lactation support  Anticipate D/C in AM  Rhea PinkVivian Grice, [facility] 06/14/2018 11:27 AM

## 2018-06-14 NOTE — Lactation Note (Signed)
This note was copied from a baby's chart. Lactation Consultation Note  Patient Name: Morgan Lashone [place] Today's Date: 06/14/2018 Reason for consult: Initial assessment   P2, Baby 7 hours old.  First child was in NICU and mother stopped bf due to concern over volume at 3 mos. Provided education on how breastmilk comes to volume and pacifier use.  Pacifier use not recommended at this time.  Reviewed hand expression prior to assisting w/ latch. Baby latched in cradle hold.  Demonstrated how to latch in cross cradle to provide good head support but mother reverted back to cradle hold.  Encouraged breast compression. Intermittent sucks and swallows observed. Provided mother w/ manual pump and demonstrated use. Discussed basics. Mom encouraged to feed baby 8-12 times/24 hours and with feeding cues.  Mom made aware of O/P services, breastfeeding support groups, community resources, and our phone # for post-discharge questions.       Maternal Data Has patient been taught Hand Expression?: Yes Does the patient have breastfeeding experience prior to this delivery?: Yes  Feeding Feeding Type: Breast Fed  LATCH Score Latch: Grasps breast easily, tongue down, lips flanged, rhythmical sucking.  Audible Swallowing: A few with stimulation  Type of Nipple: Everted at rest and after stimulation  Comfort (Breast/Nipple): Soft / non-tender  Hold (Positioning): Assistance needed to correctly position infant at breast and maintain latch.  LATCH Score: 8  Interventions Interventions: Breast feeding basics reviewed;Assisted with latch;Skin to skin;Hand express;Breast compression;Adjust position;Support pillows;Hand pump  Lactation Tools Discussed/Used     Consult Status Consult Status: Follow-up Date: 06/15/18 Follow-up type: In-patient    Dahlia ByesBerkelhammer, [facility] 06/14/2018, 10:16 AM

## 2018-06-14 NOTE — Progress Notes (Signed)
Epidural removed at 0422 catheter intact upon removal, pt tolerated well.  Luna FuseHazelwood, Rome Schlauch N RN

## 2018-06-14 NOTE — Plan of Care (Signed)
Pt showing positive progress

## 2018-06-14 NOTE — Anesthesia Postprocedure Evaluation (Signed)
Anesthesia Post Note  Patient: Jesusita OkaJavan S [place]  Procedure(s) Performed: AN AD HOC LABOR EPIDURAL     Patient location during evaluation: Mother Baby Anesthesia Type: Epidural Level of consciousness: awake and alert Pain management: pain level controlled Vital Signs Assessment: post-procedure vital signs reviewed and stable Respiratory status: spontaneous breathing, nonlabored ventilation and respiratory function stable Cardiovascular status: stable Postop Assessment: no headache, no backache, epidural receding and patient able to bend at knees Anesthetic complications: no    Last Vitals:  Vitals:   06/14/18 1334 06/14/18 1741  BP: 115/70 112/66  Pulse: 67 70  Resp: 18 18  Temp: 37 C 36.7 C  SpO2:      Last Pain:  Vitals:   06/14/18 1741  TempSrc: Oral  PainSc:    Pain Goal: Patients Stated Pain Goal: 5 (06/13/18 [id number])               Rica RecordsICKELTON,Marcelyn Ruppe

## 2018-06-15 LAB — CBC
HCT: 33.1 % — ABNORMAL LOW (ref 36.0–46.0)
Hemoglobin: 11.8 g/dL — ABNORMAL LOW (ref 12.0–15.0)
MCH: 32.8 pg (ref 26.0–34.0)
MCHC: 35.6 g/dL (ref 30.0–36.0)
MCV: 91.9 fL (ref 78.0–100.0)
Platelets: 216 10*3/uL (ref 150–400)
RBC: 3.6 MIL/uL — ABNORMAL LOW (ref 3.87–5.11)
RDW: 13.7 % (ref 11.5–15.5)
WBC: 7.8 10*3/uL (ref 4.0–10.5)

## 2018-06-15 MED ORDER — IBUPROFEN 600 MG PO TABS: 600.0000 mg | ORAL_TABLET | Freq: Four times a day (QID) | ORAL | 0 refills | Status: AC

## 2018-06-15 MED ORDER — COCONUT OIL OIL: 1.0000 "application " | TOPICAL_OIL | 0 refills | Status: AC | PRN

## 2018-06-15 MED ORDER — WITCH HAZEL-GLYCERIN EX PADS: 1.0000 "application " | MEDICATED_PAD | CUTANEOUS | 12 refills | Status: AC | PRN

## 2018-06-15 MED ORDER — ACETAMINOPHEN 325 MG PO TABS: 650.0000 mg | ORAL_TABLET | ORAL | 0 refills | Status: AC | PRN

## 2018-06-15 MED ORDER — DIBUCAINE 1 % RE OINT: 1.0000 "application " | TOPICAL_OINTMENT | RECTAL | 0 refills | Status: AC | PRN

## 2018-06-15 MED ORDER — BENZOCAINE-MENTHOL 20-0.5 % EX AERO: 1.0000 "application " | INHALATION_SPRAY | CUTANEOUS | Status: AC | PRN

## 2018-06-15 MED ORDER — FERROUS SULFATE 325 (65 FE) MG PO TABS: 325.0000 mg | ORAL_TABLET | Freq: Two times a day (BID) | ORAL | 3 refills | Status: AC

## 2018-06-15 NOTE — Discharge Instructions (Signed)
Vaginal Delivery, Care After °Refer to this sheet in the next few weeks. These instructions provide you with information about caring for yourself after vaginal delivery. Your health care provider may also give you more specific instructions. Your treatment has been planned according to current medical practices, but problems sometimes occur. Call your health care provider if you have any problems or questions. °What can I expect after the procedure? °After vaginal delivery, it is common to have: °· Some bleeding from your vagina. °· Soreness in your abdomen, your vagina, and the area of skin between your vaginal opening and your anus (perineum). °· Pelvic cramps. °· Fatigue. ° °Follow these instructions at home: °Medicines °· Take over-the-counter and prescription medicines only as told by your health care provider. °· If you were prescribed an antibiotic medicine, take it as told by your health care provider. Do not stop taking the antibiotic until it is finished. °Driving ° °· Do not drive or operate heavy machinery while taking prescription pain medicine. °· Do not drive for 24 hours if you received a sedative. °Lifestyle °· Do not drink alcohol. This is especially important if you are breastfeeding or taking medicine to relieve pain. °· Do not use tobacco products, including cigarettes, chewing tobacco, or e-cigarettes. If you need help quitting, ask your health care provider. °Eating and drinking °· Drink at least 8 eight-ounce glasses of water every day unless you are told not to by your health care provider. If you choose to breastfeed your baby, you may need to drink more water than this. °· Eat high-fiber foods every day. These foods may help prevent or relieve constipation. High-fiber foods include: °? Whole grain cereals and breads. °? Brown rice. °? Beans. °? Fresh fruits and vegetables. °Activity °· Return to your normal activities as told by your health care provider. Ask your health care provider  what activities are safe for you. °· Rest as much as possible. Try to rest or take a nap when your baby is sleeping. °· Do not lift anything that is heavier than your baby or 10 lb (4.5 kg) until your health care provider says that it is safe. °· Talk with your health care provider about when you can engage in sexual activity. This may depend on your: °? Risk of infection. °? Rate of healing. °? Comfort and desire to engage in sexual activity. °Vaginal Care °· If you have an episiotomy or a vaginal tear, check the area every day for signs of infection. Check for: °? More redness, swelling, or pain. °? More fluid or blood. °? Warmth. °? Pus or a bad smell. °· Do not use tampons or douches until your health care provider says this is safe. °· Watch for any blood clots that may pass from your vagina. These may look like clumps of dark red, brown, or black discharge. °General instructions °· Keep your perineum clean and dry as told by your health care provider. °· Wear loose, comfortable clothing. °· Wipe from front to back when you use the toilet. °· Ask your health care provider if you can shower or take a bath. If you had an episiotomy or a perineal tear during labor and delivery, your health care provider may tell you not to take baths for a certain length of time. °· Wear a bra that supports your breasts and fits you well. °· If possible, have someone help you with household activities and help care for your baby for at least a few days after   you leave the hospital. °· Keep all follow-up visits for you and your baby as told by your health care provider. This is important. °Contact a health care provider if: °· You have: °? Vaginal discharge that has a bad smell. °? Difficulty urinating. °? Pain when urinating. °? A sudden increase or decrease in the frequency of your bowel movements. °? More redness, swelling, or pain around your episiotomy or vaginal tear. °? More fluid or blood coming from your episiotomy or  vaginal tear. °? Pus or a bad smell coming from your episiotomy or vaginal tear. °? A fever. °? A rash. °? Little or no interest in activities you used to enjoy. °? Questions about caring for yourself or your baby. °· Your episiotomy or vaginal tear feels warm to the touch. °· Your episiotomy or vaginal tear is separating or does not appear to be healing. °· Your breasts are painful, hard, or turn red. °· You feel unusually sad or worried. °· You feel nauseous or you vomit. °· You pass large blood clots from your vagina. If you pass a blood clot from your vagina, save it to show to your health care provider. Do not flush blood clots down the toilet without having your health care provider look at them. °· You urinate more than usual. °· You are dizzy or light-headed. °· You have not breastfed at all and you have not had a menstrual period for 12 weeks after delivery. °· You have stopped breastfeeding and you have not had a menstrual period for 12 weeks after you stopped breastfeeding. °Get help right away if: °· You have: °? Pain that does not go away or does not get better with medicine. °? Chest pain. °? Difficulty breathing. °? Blurred vision or spots in your vision. °? Thoughts about hurting yourself or your baby. °· You develop pain in your abdomen or in one of your legs. °· You develop a severe headache. °· You faint. °· You bleed from your vagina so much that you fill two sanitary pads in one hour. °This information is not intended to replace advice given to you by your health care provider. Make sure you discuss any questions you have with your health care provider. °Document Released: 11/01/2000 Document Revised: 04/17/2016 Document Reviewed: 11/19/2015 °Elsevier Interactive Patient Education © 2018 [employer] Inc[employer] ° °

## 2018-06-15 NOTE — Discharge Summary (Signed)
Obstetric Discharge Summary Reason for Admission: induction of labor for 39 wks, favorable cervix, maternal request Prenatal Procedures: ultrasound Intrapartum Procedures: spontaneous vaginal delivery Postpartum Procedures: none Complications-Operative and Postpartum: right vaginal sulcus laceration  CBC Latest Ref Rng & Units 06/15/2018 06/13/2018   WBC 4.0 - 10.5 K/uL 7.8 5.0   Hemoglobin 12.0 - 15.0 g/dL 11.8(L) 13.0   Hematocrit 36.0 - 46.0 % 33.1(L) 37.7   Platelets 150 - 400 K/uL 216 223     Physical Exam:  General: alert and cooperative Lochia: appropriate Uterine Fundus: firm Incision: healing well DVT Evaluation: No evidence of DVT seen on physical exam. Negative Homan's sign.  Discharge Diagnoses: Term Pregnancy-delivered  Discharge Information: Date: 06/15/2018 Activity: pelvic rest Diet: routine Medications: PNV, Ibuprofen, Iron and Tylenol Condition: stable Instructions: refer to practice specific booklet Discharge to: home Follow-up Information    Maxie Betterousins, Sheronette, MD Follow up in 6 week(s).   Specialty:  Obstetrics and Gynecology Contact information: [address] [place] [address] [phone]           Newborn Data: Live born female  Birth Weight: 7 lb 3.2 oz (3265 g) APGAR: 8, 9  Newborn Delivery   Birth date/time:  06/14/2018 02:39:00 Delivery type:  Vaginal, Spontaneous     Home with mother.  Robley FriesVaishali R Sreekar Broyhill 06/15/2018, 1:15 PM

## 2018-06-15 NOTE — Progress Notes (Signed)
Patient ID: Morgan Perry, female   DOB: 1991/06/08, 27 y.o.   MRN: [id number] RN called, baby getting a d/c, mother wants dc as well

## 2018-06-15 NOTE — Progress Notes (Addendum)
Post Partum Day 1, SVD, 7/28 2.39 am.  Right vaginal sulcus tear. GIRL, breast feeding   Subjective: no complaints, up ad lib, voiding and tolerating PO. Perineal discomfort.   Objective: Blood pressure 105/64, pulse (!) 57, temperature 97.7 F (36.5 C), resp. rate 18, height 5\' 8"  (1.727 m), weight 167 lb 7 oz (75.9 kg), SpO2 99 %, unknown if currently breastfeeding.  Physical Exam:  General: alert and cooperative Lochia: appropriate Uterine Fundus: firm Incision: no concerns  DVT Evaluation: No evidence of DVT seen on physical exam.   CBC Latest Ref Rng & Units 06/15/2018 06/13/2018   WBC 4.0 - 10.5 K/uL 7.8 5.0   Hemoglobin 12.0 - 15.0 g/dL 11.8(L) 13.0   Hematocrit 36.0 - 46.0 % 33.1(L) 37.7   Platelets 150 - 400 K/uL 216 223    O(+) Rubella Immune   Assessment/Plan: Plan for discharge tomorrow, Breastfeeding and Lactation consult  Wants to spend more time with lactation in house. D/c tomorrow on PPD#2. If baby gets D/c today, patient may request early discharge   LOS: 2 days   Robley FriesVaishali R Tessia Kassin 06/15/2018, 11:20 AM

## 2018-06-15 NOTE — Lactation Note (Signed)
This note was copied from a baby's chart. Lactation Consultation Note  Patient Name: Morgan Perry Today's Date: 06/15/2018 Reason for consult: Follow-up assessment Baby is 331 hours old.  Mom reports baby is latching with ease and feeding well.  Output WNL.  Discussed milk coming to volume and prevention and treatment of engorgement.  She denies questions and concerns. Lactation outpatient services and support reviewed and encouraged prn.  Maternal Data    Feeding Feeding Type: Breast Fed  LATCH Score                   Interventions    Lactation Tools Discussed/Used     Consult Status Consult Status: Complete Follow-up type: Call as needed    Huston FoleyMOULDEN, Dedra Matsuo S 06/15/2018, 9:54 AM

## 2018-06-20 ENCOUNTER — Inpatient Hospital Stay (HOSPITAL_COMMUNITY)
Admission: AD | Admit: 2018-06-20 | Payer: Commercial Indemnity | Source: Ambulatory Visit | Admitting: Obstetrics and Gynecology

## 2018-07-24 DIAGNOSIS — Z124 Encounter for screening for malignant neoplasm of cervix: Secondary | ICD-10-CM | POA: Diagnosis not present

## 2018-07-24 DIAGNOSIS — Z130 Encounter for screening for diseases of the blood and blood-forming organs and certain disorders involving the immune mechanism: Secondary | ICD-10-CM | POA: Diagnosis not present

## 2018-08-17 DIAGNOSIS — Z30017 Encounter for initial prescription of implantable subdermal contraceptive: Secondary | ICD-10-CM | POA: Diagnosis not present

## 2018-08-31 DIAGNOSIS — Z3046 Encounter for surveillance of implantable subdermal contraceptive: Secondary | ICD-10-CM | POA: Diagnosis not present

## 2019-06-14 ENCOUNTER — Other Ambulatory Visit: Payer: Self-pay

## 2019-06-14 ENCOUNTER — Encounter (HOSPITAL_COMMUNITY): Payer: Self-pay

## 2019-06-14 ENCOUNTER — Emergency Department (HOSPITAL_COMMUNITY)
Admission: EM | Admit: 2019-06-14 | Discharge: 2019-06-14 | Disposition: A | Discharge status: Home / Self Care | Payer: Self-pay | Attending: Emergency Medicine | Admitting: Emergency Medicine

## 2019-06-14 ENCOUNTER — Emergency Department (HOSPITAL_COMMUNITY): Payer: Self-pay

## 2019-06-14 DIAGNOSIS — Y9389 Activity, other specified: Secondary | ICD-10-CM | POA: Insufficient documentation

## 2019-06-14 DIAGNOSIS — S82892A Other fracture of left lower leg, initial encounter for closed fracture: Secondary | ICD-10-CM | POA: Diagnosis not present

## 2019-06-14 DIAGNOSIS — Y999 Unspecified external cause status: Secondary | ICD-10-CM | POA: Insufficient documentation

## 2019-06-14 DIAGNOSIS — Z79899 Other long term (current) drug therapy: Secondary | ICD-10-CM | POA: Diagnosis not present

## 2019-06-14 DIAGNOSIS — S199XXA Unspecified injury of neck, initial encounter: Secondary | ICD-10-CM | POA: Diagnosis present

## 2019-06-14 DIAGNOSIS — S161XXA Strain of muscle, fascia and tendon at neck level, initial encounter: Secondary | ICD-10-CM | POA: Diagnosis not present

## 2019-06-14 DIAGNOSIS — Y92410 Unspecified street and highway as the place of occurrence of the external cause: Secondary | ICD-10-CM | POA: Diagnosis not present

## 2019-06-14 MED ORDER — IBUPROFEN 200 MG PO TABS
600.0000 mg | ORAL_TABLET | Freq: Once | ORAL | Status: AC
  Administered 2019-06-14: 600 mg via ORAL
  Filled 2019-06-14: qty 3

## 2019-06-14 NOTE — Discharge Instructions (Addendum)
Recommend taking Tylenol, Motrin as needed for pain control.  If you develop any numbness, weakness in your arms or legs, worsening back or neck pain, please return to emergency department for reassessment.  Recommend using crutches while walking.  Do not bear weight on your left leg until cleared by orthopedic surgery.  Please call the number provided to schedule follow-up appointment in 1 week.  Please keep your splint clean and dry.
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
## 2019-06-18 ENCOUNTER — Encounter: Payer: Self-pay | Admitting: Podiatry

## 2019-06-18 ENCOUNTER — Ambulatory Visit (INDEPENDENT_AMBULATORY_CARE_PROVIDER_SITE_OTHER): Payer: Medicaid Other

## 2019-06-18 ENCOUNTER — Ambulatory Visit (INDEPENDENT_AMBULATORY_CARE_PROVIDER_SITE_OTHER): Payer: Self-pay | Admitting: Podiatry

## 2019-06-18 ENCOUNTER — Other Ambulatory Visit: Payer: Self-pay

## 2019-06-18 VITALS — BP 119/72 | Temp 97.9°F

## 2019-06-18 DIAGNOSIS — S82892A Other fracture of left lower leg, initial encounter for closed fracture: Secondary | ICD-10-CM
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
